# Patient Record
Sex: Male | Born: 1990
Health system: Southern US, Community
[De-identification: ages and names within clinical notes are randomized; demographics above are authoritative.]

## PROBLEM LIST (undated history)

## (undated) DIAGNOSIS — Z789 Other specified health status: Secondary | ICD-10-CM

## (undated) HISTORY — DX: Other specified health status: Z78.9

## (undated) HISTORY — PX: OTHER SURGICAL HISTORY: SHX169

---
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
## 2012-12-31 ENCOUNTER — Encounter (HOSPITAL_COMMUNITY): Payer: Self-pay

## 2012-12-31 ENCOUNTER — Emergency Department (HOSPITAL_COMMUNITY): Payer: Self-pay

## 2012-12-31 ENCOUNTER — Emergency Department (HOSPITAL_COMMUNITY)
Admission: EM | Admit: 2012-12-31 | Discharge: 2012-12-31 | Disposition: A | Discharge status: Home / Self Care | Payer: Self-pay | Attending: Emergency Medicine | Admitting: Emergency Medicine

## 2012-12-31 DIAGNOSIS — R1012 Left upper quadrant pain: Secondary | ICD-10-CM | POA: Insufficient documentation

## 2012-12-31 DIAGNOSIS — K921 Melena: Secondary | ICD-10-CM | POA: Insufficient documentation

## 2012-12-31 DIAGNOSIS — K625 Hemorrhage of anus and rectum: Secondary | ICD-10-CM | POA: Insufficient documentation

## 2012-12-31 LAB — CBC WITH DIFFERENTIAL/PLATELET
Basophils Absolute: 0 10*3/uL (ref 0.0–0.1)
Basophils Relative: 0 % (ref 0–1)
Eosinophils Absolute: 0 10*3/uL (ref 0.0–0.7)
Eosinophils Relative: 0 % (ref 0–5)
HCT: 44.8 % (ref 39.0–52.0)
Hemoglobin: 15.9 g/dL (ref 13.0–17.0)
Lymphocytes Relative: 15 % (ref 12–46)
Lymphs Abs: 1.3 10*3/uL (ref 0.7–4.0)
MCH: 27.1 pg (ref 26.0–34.0)
MCHC: 35.5 g/dL (ref 30.0–36.0)
MCV: 76.5 fL — ABNORMAL LOW (ref 78.0–100.0)
Monocytes Absolute: 0.6 10*3/uL (ref 0.1–1.0)
Monocytes Relative: 7 % (ref 3–12)
Neutro Abs: 6.8 10*3/uL (ref 1.7–7.7)
Neutrophils Relative %: 78 % — ABNORMAL HIGH (ref 43–77)
Platelets: 259 10*3/uL (ref 150–400)
RBC: 5.86 MIL/uL — ABNORMAL HIGH (ref 4.22–5.81)
RDW: 13.4 % (ref 11.5–15.5)
WBC: 8.8 10*3/uL (ref 4.0–10.5)

## 2012-12-31 LAB — COMPREHENSIVE METABOLIC PANEL
ALT: 25 U/L (ref 0–53)
AST: 29 U/L (ref 0–37)
Albumin: 4.7 g/dL (ref 3.5–5.2)
Alkaline Phosphatase: 69 U/L (ref 39–117)
BUN: 12 mg/dL (ref 6–23)
CO2: 24 mEq/L (ref 19–32)
Calcium: 10.2 mg/dL (ref 8.4–10.5)
Chloride: 104 mEq/L (ref 96–112)
Creatinine, Ser: 0.92 mg/dL (ref 0.50–1.35)
GFR calc Af Amer: >90 mL/min (ref >90)
GFR calc non Af Amer: >90 mL/min (ref >90)
Glucose, Bld: 96 mg/dL (ref 70–99)
Potassium: 5.7 mEq/L — ABNORMAL HIGH (ref 3.5–5.1)
Sodium: 138 mEq/L (ref 135–145)
Total Bilirubin: 0.4 mg/dL (ref 0.3–1.2)
Total Protein: 8.1 g/dL (ref 6.0–8.3)

## 2012-12-31 LAB — LIPASE, BLOOD: Lipase: 18 U/L (ref 11–59)

## 2012-12-31 MED ORDER — IOHEXOL 300 MG/ML  SOLN
100.0000 mL | Freq: Once | INTRAMUSCULAR | Status: AC | PRN
  Administered 2012-12-31: 100 mL via INTRAVENOUS

## 2012-12-31 MED ORDER — IOHEXOL 300 MG/ML  SOLN
25.0000 mL | INTRAMUSCULAR | Status: AC
  Administered 2012-12-31 (×2): 25 mL via ORAL

## 2012-12-31 MED ORDER — SODIUM CHLORIDE 0.9 % IV SOLN
INTRAVENOUS | Status: DC
  Administered 2012-12-31: 17:00:00 via INTRAVENOUS

## 2012-12-31 NOTE — ED Provider Notes (Signed)
History     CSN: [id number]  Arrival date & time 12/31/12  1532   First MD Initiated Contact with Patient 12/31/12 1609      Chief Complaint  Patient presents with  . Rectal Bleeding    (Consider location/radiation/quality/duration/timing/severity/associated sxs/prior treatment) HPI Comments: The ER for evaluation of rectal bleeding. Patient reports that he had a bowel movement this morning and there was some bright red blood mixed with the stool in the toilet. Stool was soft, but not diarrhea. He was having sharp pain in his left upper abdomen at that time. Pain is still present, but much improved. He has not had any fever, nausea or vomiting.  Patient reports that he has had several episodes of this in the past, but they have spontaneously resolved and he never sought medical treatment. Mother reports a history of ulcerative colitis.  Patient is a 22 y.o. male presenting with hematochezia.  Rectal Bleeding  Associated symptoms include abdominal pain. Pertinent negatives include no fever.    History reviewed. No pertinent past medical history.  History reviewed. No pertinent past surgical history.  No family history on file.  History  Substance Use Topics  . Smoking status: Never Smoker   . Smokeless tobacco: Not on file  . Alcohol Use: No      Review of Systems  Constitutional: Negative for fever.  Gastrointestinal: Positive for abdominal pain, hematochezia and anal bleeding.  All other systems reviewed and are negative.    Allergies  Review of patient's allergies indicates no known allergies.  Home Medications  No current outpatient prescriptions on file.  BP 148/66  Pulse 91  Temp(Src) 98.6 F (37 C) (Oral)  Resp 18  Ht 5\' 8"  (1.727 m)  Wt 130 lb (58.968 kg)  BMI 19.77 kg/m2  SpO2 100%  Physical Exam  Constitutional: He is oriented to person, place, and time. He appears well-developed and well-nourished. No distress.  HENT:  Head: Normocephalic and  atraumatic.  Right Ear: Hearing normal.  Nose: Nose normal.  Mouth/Throat: Oropharynx is clear and moist and mucous membranes are normal.  Eyes: Conjunctivae and EOM are normal. Pupils are equal, round, and reactive to light.  Neck: Normal range of motion. Neck supple.  Cardiovascular: Normal rate, regular rhythm, S1 normal and S2 normal.  Exam reveals no gallop and no friction rub.   No murmur heard. Pulmonary/Chest: Effort normal and breath sounds normal. No respiratory distress. He exhibits no tenderness.  Abdominal: Soft. Normal appearance and bowel sounds are normal. There is no hepatosplenomegaly. There is tenderness in the left upper quadrant. There is no rebound, no guarding, no tenderness at McBurney's point and negative Murphy's sign. No hernia.  Musculoskeletal: Normal range of motion.  Neurological: He is alert and oriented to person, place, and time. He has normal strength. No cranial nerve deficit or sensory deficit. Coordination normal. GCS eye subscore is 4. GCS verbal subscore is 5. GCS motor subscore is 6.  Skin: Skin is warm, dry and intact. No rash noted. No cyanosis.  Psychiatric: He has a normal mood and affect. His speech is normal and behavior is normal. Thought content normal.    ED Course  Procedures (including critical care time)  Labs Reviewed  CBC WITH DIFFERENTIAL - Abnormal; Notable for the following:    RBC 5.86 (*)    MCV 76.5 (*)    Neutrophils Relative 78 (*)    All other components within normal limits  COMPREHENSIVE METABOLIC PANEL - Abnormal; Notable for the following:  Potassium 5.7 (*)    All other components within normal limits  LIPASE, BLOOD   Ct Abdomen Pelvis W Contrast  12/31/2012  *RADIOLOGY REPORT*  Clinical Data: Blood in stool.  Family history of ulcerative colitis.  CT ABDOMEN AND PELVIS WITH CONTRAST  Technique:  Multidetector CT imaging of the abdomen and pelvis was performed following the standard protocol during bolus  administration of intravenous contrast.  Contrast: OMNIPAQUE IOHEXOL 300 MG/ML  SOLN  Comparison: None.  Findings: The liver, spleen, pancreas, and adrenal glands appear unremarkable.  The gallbladder and biliary system appear unremarkable.  The kidneys appear unremarkable, as do the proximal ureters.  No pathologic retroperitoneal or porta hepatis adenopathy is identified.  Minimal stranding at the root of the mesentery is nonspecific.  Appendix normal. No pathologic pelvic adenopathy is identified.  No dilated bowel observed.  Urinary bladder empty during imaging. Equivocal wall thickening in the descending colon may be secondary to nondistension rather than inflammation.  Orally administered contrast extends through to the transverse colon  Sacroiliac joints unremarkable.  IMPRESSION:  1.  Subtle edema signal in the root of the mesentery is nonspecific but can be a associated with inflammatory process in the abdomen. 2.  No definite colitis; the borderline descending colon wall thickening is probably attributable to nondistension. 3.  A specific cause for the patient's rectal bleeding is not observed.   Original Report Authenticated By: Gaylyn Rong, M.D.      Diagnoses: 1. Abdominal pain 2. Rectal bleeding    MDM  Patient comes to the ER with complaints of mild left upper abdominal discomfort followed by a bowel movement that had blood in it. He has had episode similar to this. Patient's mother indicates that she has inflammatory bowel disease. Patient underwent diverting CAT scan, therefore, to further evaluate. No obvious inflammatory bowel disease seen. Patient has not had any further bleeding here. Will refer to GI for further evaluation. Return if he has increased bleeding.        Gilda Crease, MD 12/31/12 [phone]

## 2012-12-31 NOTE — ED Notes (Signed)
Pt.  Had a  BM this am and noticed bright red blood mixed with dk blood in the stool.    Soft stool.  Also happened in January.  Generalized abdominal pain, cramping.   Denies any n/v

## 2014-05-29 ENCOUNTER — Emergency Department (HOSPITAL_COMMUNITY)
Admission: EM | Admit: 2014-05-29 | Discharge: 2014-05-29 | Disposition: A | Discharge status: Home / Self Care | Payer: Self-pay | Attending: Emergency Medicine | Admitting: Emergency Medicine

## 2014-05-29 ENCOUNTER — Encounter (HOSPITAL_COMMUNITY): Payer: Self-pay | Admitting: Emergency Medicine

## 2014-05-29 DIAGNOSIS — R21 Rash and other nonspecific skin eruption: Secondary | ICD-10-CM

## 2014-05-29 MED ORDER — DIPHENHYDRAMINE HCL 25 MG PO TABS: 25.0000 mg | ORAL_TABLET | Freq: Four times a day (QID) | ORAL | Status: DC | PRN

## 2014-05-29 MED ORDER — DIPHENHYDRAMINE HCL 25 MG PO CAPS
25.0000 mg | ORAL_CAPSULE | Freq: Once | ORAL | Status: AC
  Administered 2014-05-29: 25 mg via ORAL
  Filled 2014-05-29: qty 1

## 2014-05-29 MED ORDER — CARRINGTON MOISTURE BARRIER EX CREA: TOPICAL_CREAM | CUTANEOUS | Status: DC | PRN

## 2014-05-29 MED ORDER — HYDROCORTISONE 1 % EX CREA: 1.0000 "application " | TOPICAL_CREAM | Freq: Two times a day (BID) | CUTANEOUS | Status: DC

## 2014-05-29 MED ORDER — HYDROCORTISONE 1 % EX CREA
TOPICAL_CREAM | Freq: Three times a day (TID) | CUTANEOUS | Status: DC
  Administered 2014-05-29: 16:00:00 via TOPICAL
  Filled 2014-05-29: qty 28

## 2014-05-29 NOTE — Discharge Instructions (Signed)
Please follow up with your primary care physician in 1-2 days. If you do not have one please call the [facility] and wellness Center number listed above. Please take all medications as prescribed. Please read all discharge instructions and return precautions.   Rash A rash is a change in the color or texture of your skin. There are many different types of rashes. You may have other problems that accompany your rash. CAUSES   Infections.  Allergic reactions. This can include allergies to pets or foods.  Certain medicines.  Exposure to certain chemicals, soaps, or cosmetics.  Heat.  Exposure to poisonous plants.  Tumors, both cancerous and noncancerous. SYMPTOMS   Redness.  Scaly skin.  Itchy skin.  Dry or cracked skin.  Bumps.  Blisters.  Pain. DIAGNOSIS  Your caregiver may do a physical exam to determine what type of rash you have. A skin sample (biopsy) may be taken and examined under a microscope. TREATMENT  Treatment depends on the type of rash you have. Your caregiver may prescribe certain medicines. For serious conditions, you may need to see a skin doctor (dermatologist). HOME CARE INSTRUCTIONS   Avoid the substance that caused your rash.  Do not scratch your rash. This can cause infection.  You may take cool baths to help stop itching.  Only take over-the-counter or prescription medicines as directed by your caregiver.  Keep all follow-up appointments as directed by your caregiver. SEEK IMMEDIATE MEDICAL CARE IF:  You have increasing pain, swelling, or redness.  You have a fever.  You have new or severe symptoms.  You have body aches, diarrhea, or vomiting.  Your rash is not better after 3 days. MAKE SURE YOU:  Understand these instructions.  Will watch your condition.  Will get help right away if you are not doing well or get worse. Document Released: 08/09/2002 Document Revised: 11/11/2011 Document Reviewed: 06/03/2011 [facility] Patient  Information 2015 [place], [place]. This information is not intended to replace advice given to you by your health care provider. Make sure you discuss any questions you have with your health care provider.

## 2014-05-29 NOTE — ED Notes (Signed)
Pt presents to department for evaluation of rash to R leg. Ongoing for several weeks. States small itchy patches, thinks this could be a spider bite. Pt is alert and oriented x4. NAD.

## 2014-05-29 NOTE — ED Provider Notes (Signed)
Medical screening examination/treatment/procedure(s) were performed by non-physician practitioner and as supervising physician I was immediately available for consultation/collaboration.   EKG Interpretation None        Courtney F Horton, MD 05/29/14 1944 

## 2014-05-29 NOTE — ED Provider Notes (Signed)
CSN: [id number]     Arrival date & time 05/29/14  1415 History  This chart was scribed for Francee Piccolo PA-C working with Shon Baton, MD by Freida Busman, ED Scribe. This patient was seen in room TR11C/TR11C and the patient's care was started at 3:39 PM.    Chief Complaint  Patient presents with  . Rash    The history is provided by the patient. No language interpreter was used.    HPI Comments:  Alim Cattell is a 23 y.o. male who presents to the Emergency Department complaining of a rash to his RLE that has been present for a few weeks. He describes the rash as itchy. He denies drainage from the site. No fever or chills. He has applied Hydrocortisone cream to the area with moderate relief. He notes a family h/o Psoriasis.   History reviewed. No pertinent past medical history. History reviewed. No pertinent past surgical history. History reviewed. No pertinent family history. History  Substance Use Topics  . Smoking status: Never Smoker   . Smokeless tobacco: Not on file  . Alcohol Use: No    Review of Systems  Constitutional: Negative for fever and chills.  Skin: Positive for rash.  All other systems reviewed and are negative.     Allergies  Review of patient's allergies indicates no known allergies.  Home Medications   Prior to Admission medications   Medication Sig Start Date End Date Taking? Authorizing Provider  diphenhydrAMINE (BENADRYL) 25 MG tablet Take 1 tablet (25 mg total) by mouth every 6 (six) hours as needed for itching (Rash). 05/29/14   Kyisha Fowle L Phil Michels, PA-C  hydrocortisone cream 1 % Apply 1 application topically 2 (two) times daily. 05/29/14   Zhaniya Swallows L Torra Pala, PA-C  Skin Protectants, Misc. (EUCERIN) cream Apply topically as needed. Apply topically for dry skin 05/29/14   Mozella Rexrode L Aerilynn Goin, PA-C   BP 130/76  Pulse 76  Temp(Src) 98 F (36.7 C) (Oral)  Resp 18  Ht  (1.727 m)  Wt 137 lb (62.143 kg)  BMI 20.84  kg/m2  SpO2 100% Physical Exam  Nursing note and vitals reviewed. Constitutional: He is oriented to person, place, and time. He appears well-developed and well-nourished. No distress.  HENT:  Head: Normocephalic and atraumatic.  Right Ear: External ear normal.  Left Ear: External ear normal.  Nose: Nose normal.  Mouth/Throat: Oropharynx is clear and moist.  Eyes: Conjunctivae are normal.  Neck: Normal range of motion. Neck supple.  Cardiovascular: Normal rate and regular rhythm.   Pulmonary/Chest: Effort normal and breath sounds normal.  Abdominal: Soft.  Musculoskeletal: Normal range of motion.  Neurological: He is alert and oriented to person, place, and time.  Skin: Skin is warm and dry. He is not diaphoretic.     Psychiatric: He has a normal mood and affect.    ED Course  Procedures  Medications  hydrocortisone cream 1 % ( Topical Given 05/29/14 1538)  diphenhydrAMINE (BENADRYL) capsule 25 mg (25 mg Oral Given 05/29/14 1537)    DIAGNOSTIC STUDIES:  Oxygen Saturation is 100% on RA, normal by my interpretation.    COORDINATION OF CARE:  3:39 PM Discussed treatment plan with pt at bedside and pt agreed to plan.  Labs Review Labs Reviewed - No data to display  Imaging Review No results found.   EKG Interpretation None      MDM   Final diagnoses:  Rash and nonspecific skin eruption    Filed Vitals:   05/29/14 1421  BP: 130/76  Pulse: 76  Temp: 98 F (36.7 C)  Resp: 18   Afebrile, NAD, non-toxic appearing, AAOx4.  No evidence of SJS or necrotizing fasciitis. Due to pruritic and not painful nature of blisters do not suspect pemphigus vulgaris. Pustules do not resemble scabies as per pt hx or allergic reaction. No blisters, no pustules, no warmth, no draining sinus tracts, no superficial abscesses, no bullous impetigo, no vesicles, no desquamation, no target lesions with dusky purpura or a central bulla. Not tender to touch. Will prescribed hydrocortisone  cream, Benadryl, and Eucerin with advised PCP f/u. Patient is stable at time of discharge  I personally performed the services described in this documentation, which was scribed in my presence. The recorded information has been reviewed and is accurate.    Jeannetta Ellis, PA-C 05/29/14 1539

## 2014-08-18 ENCOUNTER — Encounter: Payer: Self-pay | Admitting: Pediatrics

## 2018-08-27 ENCOUNTER — Emergency Department (HOSPITAL_COMMUNITY)
Admission: EM | Admit: 2018-08-27 | Discharge: 2018-08-28 | Disposition: A | Discharge status: Home / Self Care | Payer: BLUE CROSS/BLUE SHIELD | Attending: Emergency Medicine | Admitting: Emergency Medicine

## 2018-08-27 DIAGNOSIS — M545 Low back pain: Secondary | ICD-10-CM | POA: Diagnosis present

## 2018-08-27 DIAGNOSIS — S39012A Strain of muscle, fascia and tendon of lower back, initial encounter: Secondary | ICD-10-CM | POA: Diagnosis not present

## 2018-08-27 DIAGNOSIS — M542 Cervicalgia: Secondary | ICD-10-CM | POA: Insufficient documentation

## 2018-08-27 DIAGNOSIS — Y939 Activity, unspecified: Secondary | ICD-10-CM | POA: Insufficient documentation

## 2018-08-27 DIAGNOSIS — Y999 Unspecified external cause status: Secondary | ICD-10-CM | POA: Insufficient documentation

## 2018-08-27 DIAGNOSIS — R0789 Other chest pain: Secondary | ICD-10-CM | POA: Insufficient documentation

## 2018-08-27 DIAGNOSIS — Y929 Unspecified place or not applicable: Secondary | ICD-10-CM | POA: Diagnosis not present

## 2018-08-28 ENCOUNTER — Emergency Department (HOSPITAL_COMMUNITY): Payer: BLUE CROSS/BLUE SHIELD

## 2018-08-28 ENCOUNTER — Encounter (HOSPITAL_COMMUNITY): Payer: Self-pay

## 2018-08-28 ENCOUNTER — Other Ambulatory Visit: Payer: Self-pay

## 2018-08-28 MED ORDER — NAPROXEN 500 MG PO TABS: 500.0000 mg | ORAL_TABLET | Freq: Two times a day (BID) | ORAL | 0 refills | Status: DC

## 2018-08-28 MED ORDER — METHOCARBAMOL 500 MG PO TABS: 500.0000 mg | ORAL_TABLET | Freq: Two times a day (BID) | ORAL | 0 refills | Status: DC

## 2018-08-28 NOTE — Discharge Instructions (Signed)
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
## 2018-11-24 ENCOUNTER — Encounter (HOSPITAL_COMMUNITY): Payer: Self-pay

## 2018-11-24 ENCOUNTER — Ambulatory Visit (HOSPITAL_COMMUNITY)
Admission: EM | Admit: 2018-11-24 | Discharge: 2018-11-24 | Disposition: A | Discharge status: Home / Self Care | Payer: BLUE CROSS/BLUE SHIELD | Attending: Family Medicine | Admitting: Family Medicine

## 2018-11-24 ENCOUNTER — Other Ambulatory Visit: Payer: Self-pay

## 2018-11-24 DIAGNOSIS — J029 Acute pharyngitis, unspecified: Secondary | ICD-10-CM

## 2018-11-24 DIAGNOSIS — R21 Rash and other nonspecific skin eruption: Secondary | ICD-10-CM | POA: Diagnosis present

## 2018-11-24 LAB — POCT RAPID STREP A: Streptococcus, Group A Screen (Direct): NEGATIVE

## 2018-11-24 MED ORDER — TRIAMCINOLONE ACETONIDE 0.1 % EX CREA: 1.0000 "application " | TOPICAL_CREAM | Freq: Two times a day (BID) | CUTANEOUS | 0 refills | Status: DC

## 2018-11-24 MED ORDER — CHLORHEXIDINE GLUCONATE 0.12 % MT SOLN: 15.0000 mL | Freq: Two times a day (BID) | OROMUCOSAL | 0 refills | Status: DC

## 2018-11-24 NOTE — ED Provider Notes (Signed)
[facility]    CSN: [id number] Arrival date & time: 11/24/18  1902     History   Chief Complaint Chief Complaint  Patient presents with  . Sore Throat    HPI Scott Small is a 28 y.o. male.   Pt cc sore throat and penis has a sore on it x 3 weeks. Travel none, fever none, and coughing none.  The sore throat is ongoing with varying degrees of irritation.  He has not had a day off from [employer] for weeks.  He also has a small area of scaling irritation on the torsal rim (corona) of the glans.  This is nontender.  His girlfriend tested negative for HSV.     History reviewed. No pertinent past medical history.  There are no active problems to display for this patient.   History reviewed. No pertinent surgical history.     Home Medications    Prior to Admission medications   Medication Sig Start Date End Date Taking? Authorizing Provider  chlorhexidine (PERIDEX) 0.12 % solution Use as directed 15 mLs in the mouth or throat 2 (two) times daily. 11/24/18   Elvina Sidle, MD  triamcinolone cream (KENALOG) 0.1 % Apply 1 application topically 2 (two) times daily. 11/24/18   Elvina Sidle, MD    Family History Family History  Problem Relation Age of Onset  . Asthma Mother   . Diabetes Mother   . Thyroid disease Mother     Social History Social History   Tobacco Use  . Smoking status: Never Smoker  . Smokeless tobacco: Never Used  Substance Use Topics  . Alcohol use: No  . Drug use: No     Allergies   Patient has no known allergies.   Review of Systems Review of Systems  Constitutional: Negative.   HENT: Positive for sore throat.   Skin: Positive for rash.  All other systems reviewed and are negative.    Physical Exam Triage Vital Signs ED Triage Vitals  Enc Vitals Group     BP 11/24/18 1922 103/66     Pulse Rate 11/24/18 1922 84     Resp 11/24/18 1922 18     Temp 11/24/18 1922 98.7 F (37.1 C)     Temp Source 11/24/18 1922 Oral      SpO2 11/24/18 1922 100 %     Weight 11/24/18 1916 165 lb (74.8 kg)     Height --      Head Circumference --      Peak Flow --      Pain Score 11/24/18 1916 1     Pain Loc --      Pain Edu? --      Excl. in [place]? --    No data found.  Updated Vital Signs BP 103/66 (BP Location: Right Arm)   Pulse 84   Temp 98.7 F (37.1 C) (Oral)   Resp 18   Wt 74.8 kg   SpO2 100%   BMI 25.09 kg/m    Physical Exam Vitals signs and nursing note reviewed.  Constitutional:      Appearance: He is well-developed and normal weight.  HENT:     Head: Normocephalic.     Mouth/Throat:     Mouth: Mucous membranes are moist. No oral lesions.     Pharynx: Uvula midline. Posterior oropharyngeal erythema present. No pharyngeal swelling or oropharyngeal exudate.  Eyes:     Conjunctiva/sclera: Conjunctivae normal.  Neck:     Musculoskeletal: Normal range of motion  and neck supple.  Pulmonary:     Effort: Pulmonary effort is normal.  Skin:    General: Skin is warm and dry.  Neurological:     General: No focal deficit present.     Mental Status: He is alert and oriented to person, place, and time.  Psychiatric:        Mood and Affect: Mood normal.      UC Treatments / Results  Labs (all labs ordered are listed, but only abnormal results are displayed) Labs Reviewed  CULTURE, GROUP A STREP ([facility])  RPR  POCT RAPID STREP A    EKG None  Radiology No results found.  Procedures Procedures (including critical care time)  Medications Ordered in UC Medications - No data to display  Initial Impression / Assessment and Plan / UC Course  I have reviewed the triage vital signs and the nursing notes.  Pertinent labs & imaging results that were available during my care of the patient were reviewed by me and considered in my medical decision making (see chart for details).    Final Clinical Impressions(s) / UC Diagnoses   Final diagnoses:  Viral pharyngitis  Rash and nonspecific skin  eruption     Discharge Instructions     The rash is nondiagnostic.  We are running a simple blood test to rule out a bacteria.  It is most likely from friction  The strep test is negative.    ED Prescriptions    Medication Sig Dispense Auth. Provider   chlorhexidine (PERIDEX) 0.12 % solution Use as directed 15 mLs in the mouth or throat 2 (two) times daily. 120 mL Elvina Sidle, MD   triamcinolone cream (KENALOG) 0.1 % Apply 1 application topically 2 (two) times daily. 30 g Elvina Sidle, MD     Controlled Substance Prescriptions [place] Controlled Substance Registry consulted? Not Applicable   Elvina Sidle, MD 11/24/18 [phone]

## 2018-11-24 NOTE — Discharge Instructions (Signed)
The rash is nondiagnostic.  We are running a simple blood test to rule out a bacteria.  It is most likely from friction  The strep test is negative.

## 2018-11-24 NOTE — ED Triage Notes (Signed)
Pt cc sore throat and penis has a sore on it x 3 weeks. Travel none, fever none, and coughing none

## 2018-11-25 LAB — RPR: RPR Ser Ql: NONREACTIVE

## 2018-11-27 LAB — CULTURE, GROUP A STREP (THRC)

## 2019-09-20 ENCOUNTER — Other Ambulatory Visit: Payer: Self-pay

## 2019-09-20 ENCOUNTER — Ambulatory Visit: Payer: BLUE CROSS/BLUE SHIELD | Attending: Internal Medicine

## 2019-09-20 DIAGNOSIS — Z20822 Contact with and (suspected) exposure to covid-19: Secondary | ICD-10-CM | POA: Insufficient documentation

## 2019-09-21 LAB — NOVEL CORONAVIRUS, NAA: SARS-CoV-2, NAA: NOT DETECTED

## 2019-09-27 ENCOUNTER — Ambulatory Visit: Payer: BLUE CROSS/BLUE SHIELD | Attending: Family

## 2019-09-27 ENCOUNTER — Other Ambulatory Visit: Payer: Self-pay

## 2019-09-27 DIAGNOSIS — Z20822 Contact with and (suspected) exposure to covid-19: Secondary | ICD-10-CM

## 2019-09-28 LAB — NOVEL CORONAVIRUS, NAA: SARS-CoV-2, NAA: NOT DETECTED

## 2019-09-29 ENCOUNTER — Telehealth: Payer: Self-pay | Admitting: General Practice

## 2019-09-29 NOTE — Telephone Encounter (Signed)
Negative COVID results given. Patient results "NOT Detected." Caller expressed understanding. ° °
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
## 2022-06-12 ENCOUNTER — Encounter (HOSPITAL_COMMUNITY): Payer: Self-pay

## 2022-06-12 ENCOUNTER — Emergency Department (HOSPITAL_COMMUNITY)
Admission: EM | Admit: 2022-06-12 | Discharge: 2022-06-12 | Disposition: A | Discharge status: Home / Self Care | Payer: BLUE CROSS/BLUE SHIELD | Attending: Emergency Medicine | Admitting: Emergency Medicine

## 2022-06-12 ENCOUNTER — Other Ambulatory Visit: Payer: Self-pay

## 2022-06-12 DIAGNOSIS — R251 Tremor, unspecified: Secondary | ICD-10-CM | POA: Insufficient documentation

## 2022-06-12 DIAGNOSIS — T50901A Poisoning by unspecified drugs, medicaments and biological substances, accidental (unintentional), initial encounter: Secondary | ICD-10-CM | POA: Insufficient documentation

## 2022-06-12 DIAGNOSIS — F419 Anxiety disorder, unspecified: Secondary | ICD-10-CM | POA: Insufficient documentation

## 2022-06-12 DIAGNOSIS — R209 Unspecified disturbances of skin sensation: Secondary | ICD-10-CM | POA: Insufficient documentation

## 2022-06-12 DIAGNOSIS — R1084 Generalized abdominal pain: Secondary | ICD-10-CM | POA: Insufficient documentation

## 2022-06-12 NOTE — Discharge Instructions (Signed)
Please monitor your condition carefully and do not hesitate to return here for any concerning changes in your condition. °
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
## 2022-09-14 ENCOUNTER — Ambulatory Visit
Admission: EM | Admit: 2022-09-14 | Discharge: 2022-09-14 | Disposition: A | Discharge status: Home / Self Care | Payer: Self-pay | Attending: Family Medicine | Admitting: Family Medicine

## 2022-09-14 DIAGNOSIS — R300 Dysuria: Secondary | ICD-10-CM | POA: Insufficient documentation

## 2022-09-14 DIAGNOSIS — K6289 Other specified diseases of anus and rectum: Secondary | ICD-10-CM | POA: Insufficient documentation

## 2022-09-14 DIAGNOSIS — Z202 Contact with and (suspected) exposure to infections with a predominantly sexual mode of transmission: Secondary | ICD-10-CM | POA: Insufficient documentation

## 2022-09-14 MED ORDER — HYDROCORTISONE ACETATE 25 MG RE SUPP: 25.0000 mg | Freq: Two times a day (BID) | RECTAL | 0 refills | Status: DC | PRN

## 2022-09-14 MED ORDER — DOXYCYCLINE HYCLATE 100 MG PO CAPS: 100.0000 mg | ORAL_CAPSULE | Freq: Two times a day (BID) | ORAL | 0 refills | Status: DC
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
## 2022-09-14 NOTE — ED Provider Notes (Signed)
[facility]    CSN: [id number] Arrival date & time: 09/14/22  1451      History   Chief Complaint Chief Complaint  Patient presents with   Burn    Entered by patient    HPI Scott Small is a 32 y.o. male.   Patient presenting today with almost 2 weeks of dysuria and states a partner he was with about a month ago just told him yesterday that they had chlamydia.  Denies penile discharge, hematuria, rashes, lesions, pelvic or abdominal pain.  Also states he eats a lot of spicy foods and has noticed recently some anal irritation and frequent bowel movements.  He is wondering if he has hemorrhoids.  So far not tried anything for either side of symptoms.    History reviewed. No pertinent past medical history.  There are no problems to display for this patient.   History reviewed. No pertinent surgical history.     Home Medications    Prior to Admission medications   Medication Sig Start Date End Date Taking? Authorizing Provider  doxycycline (VIBRAMYCIN) 100 MG capsule Take 1 capsule (100 mg total) by mouth 2 (two) times daily. 09/14/22  Yes Volney American, PA-C  hydrocortisone (ANUSOL-HC) 25 MG suppository Place 1 suppository (25 mg total) rectally 2 (two) times daily as needed for hemorrhoids or anal itching. 09/14/22  Yes Volney American, PA-C    Family History Family History  Problem Relation Age of Onset   Asthma Mother    Diabetes Mother    Thyroid disease Mother     Social History Social History   Tobacco Use   Smoking status: Never   Smokeless tobacco: Never  Vaping Use   Vaping Use: Never used  Substance Use Topics   Alcohol use: No   Drug use: Yes    Types: Marijuana     Allergies   Patient has no known allergies.   Review of Systems Review of Systems PER HPI  Physical Exam Triage Vital Signs ED Triage Vitals [09/14/22 1502]  Enc Vitals Group     BP (!) 157/90     Pulse Rate 80     Resp 18     Temp  97.9 F (36.6 C)     Temp Source Oral     SpO2 98 %     Weight      Height      Head Circumference      Peak Flow      Pain Score      Pain Loc      Pain Edu?      Excl. in [place]?    No data found.  Updated Vital Signs BP (!) 157/90 (BP Location: Right Arm)   Pulse 80   Temp 97.9 F (36.6 C) (Oral)   Resp 18   SpO2 98%   Visual Acuity Right Eye Distance:   Left Eye Distance:   Bilateral Distance:    Right Eye Near:   Left Eye Near:    Bilateral Near:     Physical Exam Vitals and nursing note reviewed.  Constitutional:      Appearance: Normal appearance.  HENT:     Head: Atraumatic.     Mouth/Throat:     Mouth: Mucous membranes are moist.  Eyes:     Extraocular Movements: Extraocular movements intact.     Conjunctiva/sclera: Conjunctivae normal.  Cardiovascular:     Rate and Rhythm: Normal rate and regular rhythm.  Pulmonary:  Effort: Pulmonary effort is normal.     Breath sounds: Normal breath sounds.  Abdominal:     General: Bowel sounds are normal. There is no distension.     Palpations: Abdomen is soft.     Tenderness: There is no abdominal tenderness. There is no guarding.  Genitourinary:    Comments: GU exam deferred, self swab performed Musculoskeletal:        General: Normal range of motion.     Cervical back: Normal range of motion and neck supple.  Skin:    General: Skin is warm and dry.  Neurological:     General: No focal deficit present.     Mental Status: He is oriented to person, place, and time.  Psychiatric:        Mood and Affect: Mood normal.        Thought Content: Thought content normal.        Judgment: Judgment normal.      UC Treatments / Results  Labs (all labs ordered are listed, but only abnormal results are displayed) Labs Reviewed  CYTOLOGY, (ORAL, ANAL, URETHRAL) ANCILLARY ONLY    EKG   Radiology No results found.  Procedures Procedures (including critical care time)  Medications Ordered in  UC Medications - No data to display  Initial Impression / Assessment and Plan / UC Course  I have reviewed the triage vital signs and the nursing notes.  Pertinent labs & imaging results that were available during my care of the patient were reviewed by me and considered in my medical decision making (see chart for details).     Cytology swab pending, given known exposure to chlamydia and consistent symptoms will start doxycycline while awaiting results and adjust if needed.  Discussed abstinence until at least 7 days after completion of treatment.  Suspect hemorrhoids/irritation from spicy food causing his anal symptoms so we will treat with Anusol suppositories, sitz bath's, bland diet and monitoring for improvement.  Return for worsening symptoms.  Final Clinical Impressions(s) / UC Diagnoses   Final diagnoses:  Dysuria  Exposure to chlamydia  Anal irritation   Discharge Instructions   None    ED Prescriptions     Medication Sig Dispense Auth. Provider   doxycycline (VIBRAMYCIN) 100 MG capsule Take 1 capsule (100 mg total) by mouth 2 (two) times daily. 14 capsule Volney American, [place]   hydrocortisone (ANUSOL-HC) 25 MG suppository Place 1 suppository (25 mg total) rectally 2 (two) times daily as needed for hemorrhoids or anal itching. 20 suppository Volney American, [place]      PDMP not reviewed this encounter.   Volney American, [place] 09/14/22 1540

## 2022-09-16 ENCOUNTER — Emergency Department (HOSPITAL_COMMUNITY): Payer: Self-pay

## 2022-09-16 ENCOUNTER — Other Ambulatory Visit: Payer: Self-pay

## 2022-09-16 ENCOUNTER — Emergency Department (HOSPITAL_COMMUNITY)
Admission: EM | Admit: 2022-09-16 | Discharge: 2022-09-16 | Disposition: A | Discharge status: Home / Self Care | Payer: Self-pay | Attending: Emergency Medicine | Admitting: Emergency Medicine

## 2022-09-16 DIAGNOSIS — R079 Chest pain, unspecified: Secondary | ICD-10-CM | POA: Insufficient documentation

## 2022-09-16 DIAGNOSIS — E871 Hypo-osmolality and hyponatremia: Secondary | ICD-10-CM | POA: Insufficient documentation

## 2022-09-16 LAB — CBC
HCT: 49 % (ref 39.0–52.0)
Hemoglobin: 16.2 g/dL (ref 13.0–17.0)
MCH: 26.7 pg (ref 26.0–34.0)
MCHC: 33.1 g/dL (ref 30.0–36.0)
MCV: 80.7 fL (ref 80.0–100.0)
Platelets: 327 10*3/uL (ref 150–400)
RBC: 6.07 MIL/uL — ABNORMAL HIGH (ref 4.22–5.81)
RDW: 13.2 % (ref 11.5–15.5)
WBC: 7.5 10*3/uL (ref 4.0–10.5)
nRBC: 0 % (ref 0.0–0.2)

## 2022-09-16 LAB — BASIC METABOLIC PANEL
Anion gap: 10 (ref 5–15)
BUN: 18 mg/dL (ref 6–20)
CO2: 23 mmol/L (ref 22–32)
Calcium: 9 mg/dL (ref 8.9–10.3)
Chloride: 101 mmol/L (ref 98–111)
Creatinine, Ser: 1.02 mg/dL (ref 0.61–1.24)
GFR, Estimated: >60 mL/min (ref >60)
Glucose, Bld: 82 mg/dL (ref 70–99)
Potassium: 4 mmol/L (ref 3.5–5.1)
Sodium: 134 mmol/L — ABNORMAL LOW (ref 135–145)

## 2022-09-16 LAB — CYTOLOGY, (ORAL, ANAL, URETHRAL) ANCILLARY ONLY
Chlamydia: POSITIVE — AB
Comment: NEGATIVE
Comment: NEGATIVE
Comment: NORMAL
Neisseria Gonorrhea: NEGATIVE
Trichomonas: NEGATIVE

## 2022-09-16 LAB — TROPONIN I (HIGH SENSITIVITY): Troponin I (High Sensitivity): 2 ng/L (ref <18)

## 2022-09-16 NOTE — ED Provider Notes (Signed)
[facility] EMERGENCY DEPARTMENT Provider Note   CSN: [id number] Arrival date & time: 09/16/22  1238     History  Chief Complaint  Patient presents with   Chest Pain   HPI Scott Small is a 32 y.o. male presenting for chest pain. Started 2 weeks ago.  Chest pain is intermittent.  Feels sharp.  It is nonradiating and nonreproducible.  Worse with breathing in.  Denies associated shortness of breath.  Denies family history of heart disease.  Denies known history of diabetes, hypertension and hyperlipidemia.  States that recently has been waking up at night short of breath, anxious tingling in his legs.  He also states that driving down the road he can have the similar sensation that comes on all of a sudden with a sense of panic.   Chest Pain      Home Medications Prior to Admission medications   Medication Sig Start Date End Date Taking? Authorizing Provider  doxycycline (VIBRAMYCIN) 100 MG capsule Take 1 capsule (100 mg total) by mouth 2 (two) times daily. 09/14/22   Volney American, PA-C  hydrocortisone (ANUSOL-HC) 25 MG suppository Place 1 suppository (25 mg total) rectally 2 (two) times daily as needed for hemorrhoids or anal itching. 09/14/22   Volney American, PA-C      Allergies    Patient has no known allergies.    Review of Systems   Review of Systems  Cardiovascular:  Positive for chest pain.    Physical Exam Updated Vital Signs BP 132/76   Pulse 64   Resp 20   Ht 5\' 7"  (1.702 m)   Wt 71.7 kg   SpO2 100%   BMI 24.75 kg/m  Physical Exam Vitals and nursing note reviewed.  HENT:     Head: Normocephalic and atraumatic.     Mouth/Throat:     Mouth: Mucous membranes are moist.  Eyes:     General:        Right eye: No discharge.        Left eye: No discharge.     Conjunctiva/sclera: Conjunctivae normal.  Cardiovascular:     Rate and Rhythm: Normal rate and regular rhythm.     Pulses: Normal pulses.     Heart sounds: Normal heart  sounds.  Pulmonary:     Effort: Pulmonary effort is normal.     Breath sounds: Normal breath sounds.  Abdominal:     General: Abdomen is flat.     Palpations: Abdomen is soft.  Skin:    General: Skin is warm and dry.  Neurological:     General: No focal deficit present.  Psychiatric:        Mood and Affect: Mood normal.     ED Results / Procedures / Treatments   Labs (all labs ordered are listed, but only abnormal results are displayed) Labs Reviewed  BASIC METABOLIC PANEL - Abnormal; Notable for the following components:      Result Value   Sodium 134 (*)    All other components within normal limits  CBC - Abnormal; Notable for the following components:   RBC 6.07 (*)    All other components within normal limits  TROPONIN I (HIGH SENSITIVITY)    EKG EKG Interpretation  Date/Time:  Monday September 16 2022 13:16:21 EST Ventricular Rate:  70 PR Interval:  178 QRS Duration: 88 QT Interval:  356 QTC Calculation: 384 R Axis:   94 Text Interpretation: Normal sinus rhythm Rightward axis Borderline ECG No previous ECGs  available Confirmed by Aletta Edouard [phone]) on 09/16/2022 1:18:12 PM  Radiology DG Chest 2 View  Result Date: 09/16/2022 CLINICAL DATA:  Chest pain. EXAM: CHEST - 2 VIEW COMPARISON:  08/28/2018 FINDINGS: The heart size and mediastinal contours are within normal limits. Both lungs are clear. The visualized skeletal structures are unremarkable. Negative for a pneumothorax. IMPRESSION: No active cardiopulmonary disease. Electronically Signed   By: Markus Daft M.D.   On: 09/16/2022 13:37    Procedures Procedures    Medications Ordered in ED Medications - No data to display  ED Course/ Medical Decision Making/ A&P                             Medical Decision Making Amount and/or Complexity of Data Reviewed Labs: ordered. Radiology: ordered.   Initial Impression and Ddx 32 year old male who is well-appearing presenting for chest pain.  Physical exam  is unremarkable.  Differential diagnosis for this complaint includes ACS, PE, aortic dissection, and pneumonia. Patient PMH that increases complexity of ED encounter: None  Interpretation of Diagnostics I independent reviewed and interpreted the labs as followed: Mild hyponatremia  - I independently visualized the following imaging with scope of interpretation limited to determining acute life threatening conditions related to emergency care: Chest x-ray, which revealed no active acute cardiopulmonary process  I independently reviewed and interpreted EKG which revealed normal sinus rhythm.  Patient Reassessment and Ultimate Disposition/Management Doubt ACS given normal EKG and negative troponins also patient did not endorse risk factors or family history of heart disease.  Considered PE but unlikely given patient is not tachycardic and denied calf tenderness.  Considered aortic dissection but pulses were symmetric and patient is not hypertensive.  Considered pneumonia but unlikely given reassuring x-ray.  Given the nature of his chest pain and along with shortness of breath and feelings of panic and anxiety there could be component of anxiety or stress contributing to his symptoms.  Advised him to follow-up with his PCP for his atypical chest pain.  Patient management required discussion with the following services or consulting groups:  None  Complexity of Problems Addressed Acute complicated illness or Injury  Additional Data Reviewed and Analyzed Further history obtained from: None  Patient Encounter Risk Assessment None         Final Clinical Impression(s) / ED Diagnoses Final diagnoses:  Chest pain, unspecified type    Rx / DC Orders ED Discharge Orders     None         Harriet Pho, PA-C 09/16/22 1732    Sherwood Gambler, MD 09/17/22 1526

## 2022-09-16 NOTE — ED Triage Notes (Signed)
Pt presents with intermittent CP for 2 weeks, no history of anxiety per pt.

## 2022-09-16 NOTE — ED Provider Triage Note (Signed)
Emergency Medicine Provider Triage Evaluation Note  Scott Small , a 32 y.o. male  was evaluated in triage.  Pt complains of chest pain.  Started 2 weeks ago.  Chest pain is intermittent.  Feels sharp.  It is nonradiating and nonreproducible.  Worse with breathing in.  Denies associated shortness of breath.  Denies family history of heart disease.  States that recently has been waking up at night short of breath, anxious tingling in his legs.  He also states that driving down the road he can have the similar sensation that comes on all of a sudden with a sense of panic.  Review of Systems  Positive: See above Negative: See above  Physical Exam  BP 132/76   Pulse 64   Resp 20   Ht 5\' 7"  (1.702 m)   Wt 71.7 kg   SpO2 100%   BMI 24.75 kg/m  Gen:   Awake, no distress   Resp:  Normal effort  MSK:   Moves extremities without difficulty  Other:    Medical Decision Making  Medically screening exam initiated at 3:15 PM.  Appropriate orders placed.  Scott Small was informed that the remainder of the evaluation will be completed by another provider, this initial triage assessment does not replace that evaluation, and the importance of remaining in the ED until their evaluation is complete.  Work up started   Harriet Pho, PA-C 09/16/22 1518

## 2022-09-16 NOTE — Discharge Instructions (Signed)
Evaluation of your chest pain was overall reassuring.  Recommend that you follow-up with your PCP for ongoing chest pain and occurrences of anxiety and panic.  If you have new shortness of breath or worsening chest pain or any other concerns please turn to the emergency department for further evaluation.

## 2022-10-24 ENCOUNTER — Ambulatory Visit: Payer: Self-pay

## 2022-12-02 ENCOUNTER — Ambulatory Visit
Admission: EM | Admit: 2022-12-02 | Discharge: 2022-12-02 | Disposition: A | Discharge status: Home / Self Care | Payer: No Typology Code available for payment source | Attending: Family Medicine | Admitting: Family Medicine

## 2022-12-02 DIAGNOSIS — Z8619 Personal history of other infectious and parasitic diseases: Secondary | ICD-10-CM | POA: Diagnosis not present

## 2022-12-02 DIAGNOSIS — Z113 Encounter for screening for infections with a predominantly sexual mode of transmission: Secondary | ICD-10-CM | POA: Diagnosis not present

## 2022-12-02 MED ORDER — DOXYCYCLINE HYCLATE 100 MG PO CAPS: 100.0000 mg | ORAL_CAPSULE | Freq: Two times a day (BID) | ORAL | 0 refills | Status: DC

## 2022-12-02 NOTE — ED Triage Notes (Signed)
Pt requested test for STD's.

## 2022-12-02 NOTE — ED Provider Notes (Signed)
[facility]    CSN: [id number] Arrival date & time: 12/02/22  1037      History   Chief Complaint Chief Complaint  Patient presents with   Exposure to STD    HPI Jayde Tyrell Racca is a 32 y.o. male.   Patient presenting today requesting a full screening for STDs.  States he was seen for the same in January and had an exposure to chlamydia at the time so was given doxycycline but did not pick it up as he did not have insurance at the time.  He did test positive for chlamydia at this time per patient.  This was verified on chart review.  He is requesting the medication to be resent now that he has insurance.  He denies any symptoms to include dysuria, urinary frequency or burning, rashes, lesions and no new sexual partners.    History reviewed. No pertinent past medical history.  There are no problems to display for this patient.   History reviewed. No pertinent surgical history.     Home Medications    Prior to Admission medications   Medication Sig Start Date End Date Taking? Authorizing Provider  doxycycline (VIBRAMYCIN) 100 MG capsule Take 1 capsule (100 mg total) by mouth 2 (two) times daily. 12/02/22   Volney American, PA-C  hydrocortisone (ANUSOL-HC) 25 MG suppository Place 1 suppository (25 mg total) rectally 2 (two) times daily as needed for hemorrhoids or anal itching. 09/14/22   Volney American, PA-C    Family History Family History  Problem Relation Age of Onset   Asthma Mother    Diabetes Mother    Thyroid disease Mother     Social History Social History   Tobacco Use   Smoking status: Never   Smokeless tobacco: Never  Vaping Use   Vaping Use: Never used  Substance Use Topics   Alcohol use: Not Currently   Drug use: Never     Allergies   Patient has no known allergies.   Review of Systems Review of Systems PER HPI  Physical Exam Triage Vital Signs ED Triage Vitals  Enc Vitals Group     BP 12/02/22 1119  137/83     Pulse Rate 12/02/22 1119 76     Resp 12/02/22 1119 14     Temp 12/02/22 1119 99 F (37.2 C)     Temp Source 12/02/22 1119 Oral     SpO2 12/02/22 1119 96 %     Weight --      Height --      Head Circumference --      Peak Flow --      Pain Score 12/02/22 1123 0     Pain Loc --      Pain Edu? --      Excl. in [place]? --    No data found.  Updated Vital Signs BP 137/83 (BP Location: Right Arm)   Pulse 76   Temp 99 F (37.2 C) (Oral)   Resp 14   SpO2 96%   Visual Acuity Right Eye Distance:   Left Eye Distance:   Bilateral Distance:    Right Eye Near:   Left Eye Near:    Bilateral Near:     Physical Exam Vitals and nursing note reviewed.  Constitutional:      Appearance: Normal appearance.  HENT:     Head: Atraumatic.  Eyes:     Extraocular Movements: Extraocular movements intact.     Conjunctiva/sclera: Conjunctivae normal.  Cardiovascular:     Rate and Rhythm: Normal rate and regular rhythm.  Pulmonary:     Effort: Pulmonary effort is normal.     Breath sounds: Normal breath sounds.  Abdominal:     General: Bowel sounds are normal. There is no distension.     Palpations: Abdomen is soft.     Tenderness: There is no abdominal tenderness. There is no right CVA tenderness, left CVA tenderness or guarding.  Genitourinary:    Comments: GU exam deferred, self swab performed Musculoskeletal:        General: Normal range of motion.     Cervical back: Normal range of motion and neck supple.  Skin:    General: Skin is warm and dry.  Neurological:     General: No focal deficit present.     Mental Status: He is oriented to person, place, and time.  Psychiatric:        Mood and Affect: Mood normal.        Thought Content: Thought content normal.        Judgment: Judgment normal.    UC Treatments / Results  Labs (all labs ordered are listed, but only abnormal results are displayed) Labs Reviewed  HIV ANTIBODY (ROUTINE TESTING W REFLEX)  [facility]  CYTOLOGY,  (ORAL, ANAL, URETHRAL) ANCILLARY ONLY    EKG   Radiology No results found.  Procedures Procedures (including critical care time)  Medications Ordered in UC Medications - No data to display  Initial Impression / Assessment and Plan / UC Course  I have reviewed the triage vital signs and the nursing notes.  Pertinent labs & imaging results that were available during my care of the patient were reviewed by me and considered in my medical decision making (see chart for details).     Will resend doxycycline for known chlamydia infection as he states he never received the treatment.  Discussed abstinence for at least 1 week after completion of treatment and to notify partner so that they may be treated in full prior to resuming sexual activity.  HIV, syphilis and repeat cytology all pending, adjust treatment based on results if needed.  Final Clinical Impressions(s) / UC Diagnoses   Final diagnoses:  History of chlamydia  Screening examination for STI   Discharge Instructions   None    ED Prescriptions     Medication Sig Dispense Auth. Provider   doxycycline (VIBRAMYCIN) 100 MG capsule Take 1 capsule (100 mg total) by mouth 2 (two) times daily. 14 capsule Volney American, [place]      PDMP not reviewed this encounter.   Volney American, [place] 12/02/22 1222

## 2022-12-03 LAB — CYTOLOGY, (ORAL, ANAL, URETHRAL) ANCILLARY ONLY
Chlamydia: POSITIVE — AB
Comment: NEGATIVE
Comment: NEGATIVE
Comment: NORMAL
Neisseria Gonorrhea: NEGATIVE
Trichomonas: NEGATIVE

## 2022-12-03 LAB — RPR: RPR Ser Ql: NONREACTIVE

## 2022-12-03 LAB — HIV ANTIBODY (ROUTINE TESTING W REFLEX): HIV Screen 4th Generation wRfx: NONREACTIVE

## 2022-12-04 ENCOUNTER — Telehealth (HOSPITAL_COMMUNITY): Payer: Self-pay

## 2023-08-02 ENCOUNTER — Ambulatory Visit
Admission: EM | Admit: 2023-08-02 | Discharge: 2023-08-02 | Disposition: A | Discharge status: Home / Self Care | Payer: Self-pay | Attending: Family Medicine | Admitting: Family Medicine

## 2023-08-02 DIAGNOSIS — J069 Acute upper respiratory infection, unspecified: Secondary | ICD-10-CM | POA: Diagnosis not present

## 2023-08-02 DIAGNOSIS — Z113 Encounter for screening for infections with a predominantly sexual mode of transmission: Secondary | ICD-10-CM | POA: Insufficient documentation

## 2023-08-02 LAB — POC COVID19/FLU A&B COMBO
Covid Antigen, POC: NEGATIVE
Influenza A Antigen, POC: NEGATIVE
Influenza B Antigen, POC: NEGATIVE

## 2023-08-02 MED ORDER — PROMETHAZINE-DM 6.25-15 MG/5ML PO SYRP: 5.0000 mL | ORAL_SOLUTION | Freq: Four times a day (QID) | ORAL | 0 refills | Status: DC | PRN

## 2023-08-02 MED ORDER — PSEUDOEPHEDRINE HCL 60 MG PO TABS: 60.0000 mg | ORAL_TABLET | Freq: Three times a day (TID) | ORAL | 0 refills | Status: DC | PRN

## 2023-08-02 MED ORDER — FLUTICASONE PROPIONATE 50 MCG/ACT NA SUSP: 1.0000 | Freq: Two times a day (BID) | NASAL | 2 refills | Status: DC

## 2023-08-02 NOTE — Discharge Instructions (Addendum)
We will know if anything comes back positive on your STD screening.  I have sent in some medications to help with your upper respiratory symptoms and you may take over-the-counter medications such as Mucinex, ibuprofen, Tylenol additionally.  Follow-up for significantly worsening symptoms.

## 2023-08-02 NOTE — ED Triage Notes (Signed)
Pt reports he has a lost of taste and smell x 4 days.   Pt states   "he just wants to get testes. Its been a while.

## 2023-08-02 NOTE — ED Notes (Signed)
Blood sample obtained. Pt tolerated well. Dressing applied to site and pt reported "I feel like I might faint." Pt laid back, feet elevated. Offered PO fluids. Pt tolerating. Pt alert and oriented. NAD noted.

## 2023-08-03 LAB — RPR: RPR Ser Ql: NONREACTIVE

## 2023-08-03 LAB — HIV ANTIBODY (ROUTINE TESTING W REFLEX): HIV Screen 4th Generation wRfx: NONREACTIVE

## 2023-08-04 LAB — CYTOLOGY, (ORAL, ANAL, URETHRAL) ANCILLARY ONLY
Chlamydia: NEGATIVE
Comment: NEGATIVE
Comment: NEGATIVE
Comment: NORMAL
Neisseria Gonorrhea: NEGATIVE
Trichomonas: NEGATIVE

## 2023-08-05 ENCOUNTER — Ambulatory Visit
Admission: EM | Admit: 2023-08-05 | Discharge: 2023-08-05 | Disposition: A | Discharge status: Home / Self Care | Payer: Self-pay | Attending: Nurse Practitioner | Admitting: Nurse Practitioner

## 2023-08-05 ENCOUNTER — Encounter: Payer: Self-pay | Admitting: Emergency Medicine

## 2023-08-05 DIAGNOSIS — R051 Acute cough: Secondary | ICD-10-CM | POA: Diagnosis not present

## 2023-08-05 DIAGNOSIS — R042 Hemoptysis: Secondary | ICD-10-CM | POA: Diagnosis not present

## 2023-08-05 MED ORDER — BENZONATATE 100 MG PO CAPS: 100.0000 mg | ORAL_CAPSULE | Freq: Three times a day (TID) | ORAL | 0 refills | Status: DC | PRN

## 2023-08-05 NOTE — ED Provider Notes (Signed)
[facility]    CSN: [id number] Arrival date & time: 08/05/23  1537      History   Chief Complaint No chief complaint on file.   HPI Scott Small is a 32 y.o. male.   Patient presents today with 6 to 7-day history of congested cough, shortness of breath with coughing, runny and stuffy nose, sore throat, right ear pain without drainage, abdominal pain that is intermittent, decreased appetite, loss of taste and smell, and fatigue.  He denies known fevers, bodyaches or chills, wheezing, chest pain or tightness, sinus pressure or headache, nausea, vomiting, and diarrhea.  He was seen in urgent care on 08/02/2023, treated with Flonase nasal spray, Sudafed, Promethazine DM cough syrup reports has been taking everything except the Sudafed.  Woke up this morning and began coughing up blood and became concerned for pneumonia.    History reviewed. No pertinent past medical history.  There are no problems to display for this patient.   History reviewed. No pertinent surgical history.     Home Medications    Prior to Admission medications   Medication Sig Start Date End Date Taking? Authorizing Provider  benzonatate (TESSALON) 100 MG capsule Take 1 capsule (100 mg total) by mouth 3 (three) times daily as needed for cough. Do not take with alcohol or while driving or operating heavy machinery.  May cause drowsiness. 08/05/23  Yes Valentino Nose, NP  fluticasone (FLONASE) 50 MCG/ACT nasal spray Place 1 spray into both nostrils 2 (two) times daily. 08/02/23   Particia Nearing, PA-C  hydrocortisone (ANUSOL-HC) 25 MG suppository Place 1 suppository (25 mg total) rectally 2 (two) times daily as needed for hemorrhoids or anal itching. 09/14/22   Particia Nearing, PA-C  promethazine-dextromethorphan (PROMETHAZINE-DM) 6.25-15 MG/5ML syrup Take 5 mLs by mouth 4 (four) times daily as needed. 08/02/23   Particia Nearing, PA-C  pseudoephedrine (SUDAFED) 60 MG  tablet Take 1 tablet (60 mg total) by mouth every 8 (eight) hours as needed for congestion. 08/02/23   Particia Nearing, PA-C    Family History Family History  Problem Relation Age of Onset   Asthma Mother    Diabetes Mother    Thyroid disease Mother     Social History Social History   Tobacco Use   Smoking status: Never   Smokeless tobacco: Never  Vaping Use   Vaping status: Never Used  Substance Use Topics   Alcohol use: Not Currently   Drug use: Never     Allergies   Patient has no known allergies.   Review of Systems Review of Systems Per HPI  Physical Exam Triage Vital Signs ED Triage Vitals  Encounter Vitals Group     BP 08/05/23 1548 (!) 163/79     Systolic BP Percentile --      Diastolic BP Percentile --      Pulse Rate 08/05/23 1548 72     Resp 08/05/23 1548 18     Temp 08/05/23 1548 98.2 F (36.8 C)     Temp Source 08/05/23 1548 Oral     SpO2 08/05/23 1548 98 %     Weight --      Height --      Head Circumference --      Peak Flow --      Pain Score 08/05/23 1549 6     Pain Loc --      Pain Education --      Exclude from Growth Chart --  No data found.  Updated Vital Signs BP (!) 163/79 (BP Location: Right Arm)   Pulse 72   Temp 98.2 F (36.8 C) (Oral)   Resp 18   SpO2 98%   BP recheck: 144/84  Visual Acuity Right Eye Distance:   Left Eye Distance:   Bilateral Distance:    Right Eye Near:   Left Eye Near:    Bilateral Near:     Physical Exam Vitals and nursing note reviewed.  Constitutional:      General: He is not in acute distress.    Appearance: Normal appearance. He is not ill-appearing or toxic-appearing.  HENT:     Head: Normocephalic and atraumatic.     Right Ear: Tympanic membrane, ear canal and external ear normal.     Left Ear: Tympanic membrane, ear canal and external ear normal.     Nose: Congestion and rhinorrhea present.     Mouth/Throat:     Mouth: Mucous membranes are moist.     Pharynx:  Oropharynx is clear. No oropharyngeal exudate or posterior oropharyngeal erythema.  Eyes:     General: No scleral icterus.    Extraocular Movements: Extraocular movements intact.  Cardiovascular:     Rate and Rhythm: Normal rate and regular rhythm.  Pulmonary:     Effort: Pulmonary effort is normal. No respiratory distress.     Breath sounds: Normal breath sounds. No wheezing, rhonchi or rales.  Musculoskeletal:     Cervical back: Normal range of motion and neck supple.  Lymphadenopathy:     Cervical: No cervical adenopathy.  Skin:    General: Skin is warm and dry.     Coloration: Skin is not jaundiced or pale.     Findings: No erythema or rash.  Neurological:     Mental Status: He is alert and oriented to person, place, and time.  Psychiatric:        Behavior: Behavior is cooperative.      UC Treatments / Results  Labs (all labs ordered are listed, but only abnormal results are displayed) Labs Reviewed - No data to display  EKG   Radiology No results found.  Procedures Procedures (including critical care time)  Medications Ordered in UC Medications - No data to display  Initial Impression / Assessment and Plan / UC Course  I have reviewed the triage vital signs and the nursing notes.  Pertinent labs & imaging results that were available during my care of the patient were reviewed by me and considered in my medical decision making (see chart for details).   Patient is well-appearing, normotensive, afebrile, not tachycardic, not tachypneic, oxygenating well on room air.  I rechecked blood pressure and it was much improved from initial triage blood pressure.  1. Acute cough Suspect ongoing to viral upper respiratory infection Patient is negative for strep, COVID-19, and influenza Further viral testing deferred Supportive care discussed, start Tessalon Perles recommended starting decongestant as previously recommended Return and ER precautions discussed  2.  Hemoptysis Likely from Flonase nasal spray, he is also having bloody nasal drainage Lungs are clear to auscultation, SpO2 is normal Chest x-ray deferred No signs of pneumonia, reassurance provided Work excuse provided  The patient was given the opportunity to ask questions.  All questions answered to their satisfaction.  The patient is in agreement to this plan.    Final Clinical Impressions(s) / UC Diagnoses   Final diagnoses:  Acute cough  Hemoptysis     Discharge Instructions  You have a viral upper respiratory infection.  Symptoms should improve over the next week to 10 days.  If you develop chest pain or shortness of breath, go to the emergency room.  Some things that can make you feel better are: - Increased rest - Increasing fluid with water/sugar free electrolytes - Acetaminophen and ibuprofen as needed for fever/pain - Salt water gargling, chloraseptic spray and throat lozenges for sore throat - OTC guaifenesin (Mucinex) 600 mg twice daily for congestion - Saline sinus flushes or a neti pot - Humidifying the air -Tessalon Perles every 8 hours as needed for dry cough   Recommend stopping flonase nasal spray to see if this helps resolve the bleeding     ED Prescriptions     Medication Sig Dispense Auth. Provider   benzonatate (TESSALON) 100 MG capsule Take 1 capsule (100 mg total) by mouth 3 (three) times daily as needed for cough. Do not take with alcohol or while driving or operating heavy machinery.  May cause drowsiness. 21 capsule Valentino Nose, NP      PDMP not reviewed this encounter.   Valentino Nose, NP 08/05/23 1626

## 2023-08-05 NOTE — Discharge Instructions (Signed)
You have a viral upper respiratory infection.  Symptoms should improve over the next week to 10 days.  If you develop chest pain or shortness of breath, go to the emergency room.  Some things that can make you feel better are: - Increased rest - Increasing fluid with water/sugar free electrolytes - Acetaminophen and ibuprofen as needed for fever/pain - Salt water gargling, chloraseptic spray and throat lozenges for sore throat - OTC guaifenesin (Mucinex) 600 mg twice daily for congestion - Saline sinus flushes or a neti pot - Humidifying the air -Tessalon Perles every 8 hours as needed for dry cough   Recommend stopping flonase nasal spray to see if this helps resolve the bleeding

## 2023-08-05 NOTE — ED Triage Notes (Signed)
Was seen on Saturday for cough.  States early this morning, he noticed blood in his sputum and has continued through out the day.

## 2023-08-06 NOTE — ED Provider Notes (Signed)
[facility]    CSN: [id number] Arrival date & time: 08/02/23  1523      History   Chief Complaint Chief Complaint  Patient presents with   Sore Throat    HPI Scott Small is a 32 y.o. male.   Patient presenting today with 4-day history of congestion, loss of taste and smell, cough.  Denies fever, chills, chest pain, shortness of breath, abdominal pain, nausea vomiting or diarrhea.  So for now try anything over-the-counter for symptoms.  Also wanting STD screening.  No exposures or symptoms.    History reviewed. No pertinent past medical history.  There are no problems to display for this patient.   History reviewed. No pertinent surgical history.     Home Medications    Prior to Admission medications   Medication Sig Start Date End Date Taking? Authorizing Provider  fluticasone (FLONASE) 50 MCG/ACT nasal spray Place 1 spray into both nostrils 2 (two) times daily. 08/02/23  Yes Particia Nearing, PA-C  promethazine-dextromethorphan (PROMETHAZINE-DM) 6.25-15 MG/5ML syrup Take 5 mLs by mouth 4 (four) times daily as needed. 08/02/23  Yes Particia Nearing, PA-C  pseudoephedrine (SUDAFED) 60 MG tablet Take 1 tablet (60 mg total) by mouth every 8 (eight) hours as needed for congestion. 08/02/23  Yes Particia Nearing, PA-C  benzonatate (TESSALON) 100 MG capsule Take 1 capsule (100 mg total) by mouth 3 (three) times daily as needed for cough. Do not take with alcohol or while driving or operating heavy machinery.  May cause drowsiness. 08/05/23   Valentino Nose, NP  hydrocortisone (ANUSOL-HC) 25 MG suppository Place 1 suppository (25 mg total) rectally 2 (two) times daily as needed for hemorrhoids or anal itching. 09/14/22   Particia Nearing, PA-C    Family History Family History  Problem Relation Age of Onset   Asthma Mother    Diabetes Mother    Thyroid disease Mother     Social History Social History   Tobacco Use    Smoking status: Never   Smokeless tobacco: Never  Vaping Use   Vaping status: Never Used  Substance Use Topics   Alcohol use: Not Currently   Drug use: Never     Allergies   Patient has no known allergies.   Review of Systems Review of Systems Per HPI  Physical Exam Triage Vital Signs ED Triage Vitals  Encounter Vitals Group     BP 08/02/23 1534 131/87     Systolic BP Percentile --      Diastolic BP Percentile --      Pulse Rate 08/02/23 1534 70     Resp 08/02/23 1534 20     Temp 08/02/23 1534 98.7 F (37.1 C)     Temp Source 08/02/23 1534 Oral     SpO2 08/02/23 1534 97 %     Weight --      Height --      Head Circumference --      Peak Flow --      Pain Score 08/02/23 1536 7     Pain Loc --      Pain Education --      Exclude from Growth Chart --    No data found.  Updated Vital Signs BP 131/87 (BP Location: Right Arm)   Pulse 70   Temp 98.7 F (37.1 C) (Oral)   Resp 20   SpO2 97%   Visual Acuity Right Eye Distance:   Left Eye Distance:   Bilateral Distance:  Right Eye Near:   Left Eye Near:    Bilateral Near:     Physical Exam Vitals and nursing note reviewed.  Constitutional:      Appearance: He is well-developed.  HENT:     Head: Atraumatic.     Right Ear: External ear normal.     Left Ear: External ear normal.     Nose: Rhinorrhea present.     Mouth/Throat:     Pharynx: Posterior oropharyngeal erythema present. No oropharyngeal exudate.  Eyes:     Conjunctiva/sclera: Conjunctivae normal.     Pupils: Pupils are equal, round, and reactive to light.  Cardiovascular:     Rate and Rhythm: Normal rate and regular rhythm.  Pulmonary:     Effort: Pulmonary effort is normal. No respiratory distress.     Breath sounds: No wheezing or rales.  Genitourinary:    Comments: GU exam deferred, self swab performed Musculoskeletal:        General: Normal range of motion.     Cervical back: Normal range of motion and neck supple.  Lymphadenopathy:      Cervical: No cervical adenopathy.  Skin:    General: Skin is warm and dry.  Neurological:     Mental Status: He is alert and oriented to person, place, and time.  Psychiatric:        Behavior: Behavior normal.      UC Treatments / Results  Labs (all labs ordered are listed, but only abnormal results are displayed) Labs Reviewed  HIV ANTIBODY (ROUTINE TESTING W REFLEX)   Narrative:    Performed at:  [address] [facility] [address] [place], [place]  [id number] Lab Director: Jolene Schimke MD, Phone:  [phone]  [facility]   Narrative:    Performed at:  [address] [facility] [address] [place], [place]  [id number] Lab Director: Jolene Schimke MD, Phone:  [phone]  POC COVID19/FLU A&B COMBO  CYTOLOGY, (ORAL, ANAL, URETHRAL) ANCILLARY ONLY    EKG   Radiology No results found.  Procedures Procedures (including critical care time)  Medications Ordered in UC Medications - No data to display  Initial Impression / Assessment and Plan / UC Course  I have reviewed the triage vital signs and the nursing notes.  Pertinent labs & imaging results that were available during my care of the patient were reviewed by me and considered in my medical decision making (see chart for details).     Vital signs and exam overall reassuring and suggestive of a viral respiratory infection.  COVID and flu testing negative in clinic, treat symptomatically with Flonase, Sudafed, Phenergan DM, supportive over-the-counter medications and home care.  STD screening pending.  Treat based on results.  Final Clinical Impressions(s) / UC Diagnoses   Final diagnoses:  Screening examination for STI  Viral URI with cough     Discharge Instructions      We will know if anything comes back positive on your STD screening.  I have sent in some medications to help with your upper respiratory symptoms and you may take over-the-counter medications such as Mucinex, ibuprofen, Tylenol  additionally.  Follow-up for significantly worsening symptoms.    ED Prescriptions     Medication Sig Dispense Auth. Provider   fluticasone (FLONASE) 50 MCG/ACT nasal spray Place 1 spray into both nostrils 2 (two) times daily. 16 g Particia Nearing, [place]   pseudoephedrine (SUDAFED) 60 MG tablet Take 1 tablet (60 mg total) by mouth every 8 (eight) hours as needed for  congestion. 15 tablet Particia Nearing, [place]   promethazine-dextromethorphan (PROMETHAZINE-DM) 6.25-15 MG/5ML syrup Take 5 mLs by mouth 4 (four) times daily as needed. 100 mL Particia Nearing, [place]      PDMP not reviewed this encounter.   Roosvelt Maser [place], [place] 08/06/23 [phone]

## 2024-05-25 ENCOUNTER — Ambulatory Visit
Admission: RE | Admit: 2024-05-25 | Discharge: 2024-05-25 | Disposition: A | Discharge status: Home / Self Care | Payer: Self-pay

## 2024-05-25 ENCOUNTER — Other Ambulatory Visit: Payer: Self-pay

## 2024-05-25 VITALS — BP 144/86 | HR 70 | Temp 98.9°F | Resp 16

## 2024-05-25 DIAGNOSIS — G44209 Tension-type headache, unspecified, not intractable: Secondary | ICD-10-CM

## 2024-05-25 DIAGNOSIS — G4482 Headache associated with sexual activity: Secondary | ICD-10-CM

## 2024-05-25 NOTE — ED Provider Notes (Signed)
 [name]    CSN: [id number] Arrival date & time: 05/25/24  1404      History   Chief Complaint Chief Complaint  Patient presents with   Headache    Entered by patient    HPI Scott Small is a 33 y.o. male.   Very pleasant 33 year old gentleman who is here today for evaluation of headaches.  He has 2 different kinds of headaches.  First for the last couple months has had a headache almost every day.  He has tension across his neck and back.  He is under stress.  He states he has a headache when he wakes up in the morning.  He has a headache off and on most days.  He states that he had migraines when he was younger but this is different.  No visual symptoms.  No nausea or vomiting.  No photophobia The second type of headache he describes is with orgasm.  He states that with orgasm he gets a sudden severe headache that makes him need to rest for a while before he can continue activity.  He states that he had this once and it was terrible, he tried sexual activity once more a couple weeks later and he had a similar reaction. No neurosymptoms.  No problems with cognition or vision.  No problems with strength or balance.    History reviewed. No pertinent past medical history.  There are no active problems to display for this patient.   History reviewed. No pertinent surgical history.     Home Medications    Prior to Admission medications   Not on File    Family History Family History  Problem Relation Age of Onset   Asthma Mother    Diabetes Mother    Thyroid disease Mother     Social History Social History   Tobacco Use   Smoking status: Never   Smokeless tobacco: Never  Vaping Use   Vaping status: Never Used  Substance Use Topics   Alcohol use: Not Currently   Drug use: Never     Allergies   Patient has no known allergies.   Review of Systems Review of Systems   Physical Exam Triage Vital Signs ED Triage Vitals  Encounter  Vitals Group     BP 05/25/24 1417 (!) 144/86     Girls Systolic BP Percentile --      Girls Diastolic BP Percentile --      Boys Systolic BP Percentile --      Boys Diastolic BP Percentile --      Pulse Rate 05/25/24 1417 70     Resp 05/25/24 1417 16     Temp 05/25/24 1417 98.9 F (37.2 C)     Temp src --      SpO2 05/25/24 1417 99 %     Weight --      Height --      Head Circumference --      Peak Flow --      Pain Score 05/25/24 1419 10     Pain Loc --      Pain Education --      Exclude from Growth Chart --    No data found.  Updated Vital Signs BP (!) 144/86   Pulse 70   Temp 98.9 F (37.2 C)   Resp 16   SpO2 99%       Physical Exam Constitutional:      General: He is not in acute distress.  Appearance: He is well-developed and normal weight.  HENT:     Head: Normocephalic and atraumatic.     Mouth/Throat:     Mouth: Mucous membranes are moist.  Eyes:     General: No visual field deficit.    Extraocular Movements:     Right eye: Normal extraocular motion and no nystagmus.     Left eye: Normal extraocular motion and no nystagmus.     Conjunctiva/sclera: Conjunctivae normal.     Pupils: Pupils are equal, round, and reactive to light.     Right eye: Pupil is round and reactive.     Left eye: Pupil is round and reactive.     Comments: Discs are flat  Neck:     Comments: Neck muscles tight and tender Cardiovascular:     Rate and Rhythm: Normal rate.  Pulmonary:     Effort: Pulmonary effort is normal. No respiratory distress.  Abdominal:     General: There is no distension.     Palpations: Abdomen is soft.  Musculoskeletal:        General: Normal range of motion.     Cervical back: Normal range of motion.  Skin:    General: Skin is warm and dry.  Neurological:     Mental Status: He is alert and oriented to person, place, and time. Mental status is at baseline.     Cranial Nerves: No cranial nerve deficit, dysarthria or facial asymmetry.     Gait:  Gait normal.  Psychiatric:        Mood and Affect: Mood normal.        Behavior: Behavior normal.      UC Treatments / Results  Labs (all labs ordered are listed, but only abnormal results are displayed) Labs Reviewed - No data to display  EKG   Radiology No results found.  Procedures Procedures (including critical care time)  Medications Ordered in UC Medications - No data to display  Initial Impression / Assessment and Plan / UC Course  I have reviewed the triage vital signs and the nursing notes.  Pertinent labs & imaging results that were available during my care of the patient were reviewed by me and considered in my medical decision making (see chart for details).     Explained to the patient describing 2 different kinds of headache.  The muscle tension headache is the one present daily and is associated with a tightness in his neck muscles.  This is treated with anti-inflammatory medications, muscle relaxers at bedtime, good health habits such as eating regularly sleeping regularly getting enough exercise. Orgasmic headaches are different matter.  These require different evaluation.  Patient needs a CT of his head to make sure he does not have an aneurysm or subarachnoid hemorrhage.  Although unlikely, I did look this up and up-to-date on the recommendation is clear.  Discussed with patient she should go to the emergency room Final Clinical Impressions(s) / UC Diagnoses   Final diagnoses:  Muscle tension headache  Headache associated with orgasm     Discharge Instructions      For the muscle tension headache take ibuprofen up to 3 X a day for pain Take the flexeril at bedtime Ice or heat to neck muscles  For the other headache you should go to the ER   ED Prescriptions   None    PDMP not reviewed this encounter.   Maranda Jamee Jacob, MD 05/25/24 [phone]

## 2024-05-25 NOTE — Discharge Instructions (Signed)
 For the muscle tension headache take ibuprofen up to 3 X a day for pain Take the flexeril at bedtime Ice or heat to neck muscles  For the other headache you should go to the ER

## 2024-05-25 NOTE — ED Notes (Signed)
 Patient is being discharged from the Urgent Care and sent to the Emergency Department via private vehicle . Per Dr Maranda, patient is in need of higher level of care due to further evaluation. Patient is aware and verbalizes understanding of plan of care.  Vitals:   05/25/24 1417  BP: (!) 144/86  Pulse: 70  Resp: 16  Temp: 98.9 F (37.2 C)  SpO2: 99%

## 2024-05-25 NOTE — ED Triage Notes (Signed)
 HA x almost 2 months. Reports he is under a lot of stress. Reports his neck feels stiff, he feels nauseated, head hurts all over, a little confused. No fever. Has not had otc meds.

## 2024-08-08 ENCOUNTER — Ambulatory Visit
Admission: EM | Admit: 2024-08-08 | Discharge: 2024-08-08 | Disposition: A | Discharge status: Home / Self Care | Payer: Self-pay | Attending: Family Medicine | Admitting: Family Medicine

## 2024-08-08 ENCOUNTER — Other Ambulatory Visit: Payer: Self-pay

## 2024-08-08 DIAGNOSIS — R066 Hiccough: Secondary | ICD-10-CM

## 2024-08-08 DIAGNOSIS — Z9189 Other specified personal risk factors, not elsewhere classified: Secondary | ICD-10-CM

## 2024-08-08 MED ORDER — BACLOFEN 10 MG PO TABS: 10.0000 mg | ORAL_TABLET | Freq: Three times a day (TID) | ORAL | 0 refills | Status: DC

## 2024-08-08 NOTE — ED Triage Notes (Signed)
 Pt presenting with c/o constant hiccups x 2 weeks resulting in  bilateral rib pain and nausea. No medication taken for symptoms. Pt also requesting a STD test.

## 2024-08-08 NOTE — Discharge Instructions (Signed)
 Take the baclofen  as directed to help with hiccups May take 1 or 2 at bedtime  Your swab has been sent to the laboratory for analysis.  You will be called if any tests are positive

## 2024-08-08 NOTE — ED Provider Notes (Signed)
 [name]    CSN: [id number] Arrival date & time: 08/08/24  1505      History   Chief Complaint Chief Complaint  Patient presents with   Hiccups    HPI Scott Small is a 33 y.o. male.   Patient states that he has had hiccups every day for the last couple of months.  He will have pauses but then they started again.  They happen all day long.  He states his ribs are sore.  He feels nauseated.  He is very tired of the hiccups and wonders if there is treatment.  He does not have heartburn or GERD.  Appetite has been normal. Also endorses unprotected sex and would like an STD check.  He is asymptomatic    History reviewed. No pertinent past medical history.  There are no active problems to display for this patient.   History reviewed. No pertinent surgical history.     Home Medications    Prior to Admission medications   Medication Sig Start Date End Date Taking? Authorizing Provider  baclofen  (LIORESAL ) 10 MG tablet Take 1 tablet (10 mg total) by mouth 3 (three) times daily. 08/08/24  Yes Maranda Jamee Jacob, MD    Family History Family History  Problem Relation Age of Onset   Asthma Mother    Diabetes Mother    Thyroid disease Mother     Social History Social History   Tobacco Use   Smoking status: Never   Smokeless tobacco: Never  Vaping Use   Vaping status: Never Used  Substance Use Topics   Alcohol use: Not Currently   Drug use: Never     Allergies   Patient has no known allergies.   Review of Systems Review of Systems See HPI  Physical Exam Triage Vital Signs ED Triage Vitals  Encounter Vitals Group     BP 08/08/24 1524 (!) 142/85     Girls Systolic BP Percentile --      Girls Diastolic BP Percentile --      Boys Systolic BP Percentile --      Boys Diastolic BP Percentile --      Pulse Rate 08/08/24 1524 77     Resp 08/08/24 1524 20     Temp 08/08/24 1524 99 F (37.2 C)     Temp Source 08/08/24 1524 Oral      SpO2 08/08/24 1524 99 %     Weight 08/08/24 1527 161 lb (73 kg)     Height 08/08/24 1527 5' 7 (1.702 m)     Head Circumference --      Peak Flow --      Pain Score 08/08/24 1526 7     Pain Loc --      Pain Education --      Exclude from Growth Chart --    No data found.  Updated Vital Signs BP (!) 142/85 (BP Location: Right Arm)   Pulse 77   Temp 99 F (37.2 C) (Oral)   Resp 20   Ht 5' 7 (1.702 m)   Wt 73 kg   SpO2 99%   BMI 25.22 kg/m    Physical Exam Constitutional:      General: He is not in acute distress.    Appearance: He is well-developed.  HENT:     Head: Normocephalic and atraumatic.  Eyes:     Conjunctiva/sclera: Conjunctivae normal.     Pupils: Pupils are equal, round, and reactive to light.  Cardiovascular:  Rate and Rhythm: Normal rate and regular rhythm.     Heart sounds: Normal heart sounds.  Pulmonary:     Effort: Pulmonary effort is normal. No respiratory distress.     Breath sounds: Normal breath sounds.  Chest:     Chest wall: Tenderness present.  Abdominal:     General: There is distension.  Musculoskeletal:        General: Normal range of motion.     Cervical back: Normal range of motion.  Skin:    General: Skin is warm and dry.  Neurological:     Mental Status: He is alert.      UC Treatments / Results  Labs (all labs ordered are listed, but only abnormal results are displayed) Labs Reviewed  CYTOLOGY, (ORAL, ANAL, URETHRAL) ANCILLARY ONLY    EKG   Radiology No results found.  Procedures Procedures (including critical care time)  Medications Ordered in UC Medications - No data to display  Initial Impression / Assessment and Plan / UC Course  I have reviewed the triage vital signs and the nursing notes.  Pertinent labs & imaging results that were available during my care of the patient were reviewed by me and considered in my medical decision making (see chart for details).     Final Clinical Impressions(s) /  UC Diagnoses   Final diagnoses:  Intractable hiccups  At risk for sexually transmitted disease due to unprotected sex     Discharge Instructions      Take the baclofen  as directed to help with hiccups May take 1 or 2 at bedtime  Your swab has been sent to the laboratory for analysis.  You will be called if any tests are positive   ED Prescriptions     Medication Sig Dispense Auth. Provider   baclofen  (LIORESAL ) 10 MG tablet Take 1 tablet (10 mg total) by mouth 3 (three) times daily. 30 each Maranda Jamee Jacob, MD      PDMP not reviewed this encounter.   Maranda Jamee Jacob, MD 08/08/24 [phone]

## 2024-08-09 LAB — CYTOLOGY, (ORAL, ANAL, URETHRAL) ANCILLARY ONLY
Chlamydia: NEGATIVE
Comment: NEGATIVE
Comment: NEGATIVE
Comment: NORMAL
Neisseria Gonorrhea: NEGATIVE
Trichomonas: NEGATIVE

## 2024-08-16 ENCOUNTER — Other Ambulatory Visit: Payer: Self-pay

## 2024-08-16 ENCOUNTER — Ambulatory Visit: Payer: Self-pay

## 2024-08-16 ENCOUNTER — Ambulatory Visit
Admission: RE | Admit: 2024-08-16 | Discharge: 2024-08-16 | Disposition: A | Discharge status: Home / Self Care | Payer: Self-pay | Attending: Family Medicine | Admitting: Family Medicine

## 2024-08-16 VITALS — BP 144/81 | HR 72 | Temp 99.0°F | Resp 20 | Ht 67.0 in | Wt 160.0 lb

## 2024-08-16 DIAGNOSIS — M545 Low back pain, unspecified: Secondary | ICD-10-CM

## 2024-08-16 DIAGNOSIS — R0789 Other chest pain: Secondary | ICD-10-CM

## 2024-08-16 MED ORDER — PREDNISONE 10 MG (21) PO TBPK: ORAL_TABLET | Freq: Every day | ORAL | 0 refills | Status: DC

## 2024-08-16 NOTE — ED Provider Notes (Signed)
 [name]    CSN: [id number] Arrival date & time: 08/16/24  1136      History   Chief Complaint Chief Complaint  Patient presents with   Back Pain    Nausea, rib pain & hiccups as well as back pain - Entered by patient    HPI Scott Small is a 33 y.o. male.   HPI pleasant 33 year old male presents with nausea, back pain and rib pain for 3 weeks.  Patient also is concerned with intractable hiccups.  Patient reports having mild allergic reaction to baclofen  prescribed on 08/08/2024.  History reviewed. No pertinent past medical history.  There are no active problems to display for this patient.   History reviewed. No pertinent surgical history.     Home Medications    Prior to Admission medications  Medication Sig Start Date End Date Taking? Authorizing Provider  predniSONE  (STERAPRED UNI-PAK 21 TAB) 10 MG (21) TBPK tablet Take by mouth daily. Take 6 tabs by mouth daily  for 2 days, then 5 tabs for 2 days, then 4 tabs for 2 days, then 3 tabs for 2 days, 2 tabs for 2 days, then 1 tab by mouth daily for 2 days 08/16/24  Yes Teddy Sharper, FNP    Family History Family History  Problem Relation Age of Onset   Asthma Mother    Diabetes Mother    Thyroid disease Mother     Social History Social History[1]   Allergies   Baclofen    Review of Systems Review of Systems  Musculoskeletal:  Positive for back pain.       Bilateral chest wall pain for 3 weeks per patient  All other systems reviewed and are negative.    Physical Exam Triage Vital Signs ED Triage Vitals  Encounter Vitals Group     BP      Girls Systolic BP Percentile      Girls Diastolic BP Percentile      Boys Systolic BP Percentile      Boys Diastolic BP Percentile      Pulse      Resp      Temp      Temp src      SpO2      Weight      Height      Head Circumference      Peak Flow      Pain Score      Pain Loc      Pain Education      Exclude from Growth Chart     No data found.  Updated Vital Signs BP (!) 144/81 (BP Location: Right Arm)   Pulse 72   Temp 99 F (37.2 C) (Oral)   Resp 20   Ht 5' 7 (1.702 m)   Wt 160 lb (72.6 kg)   SpO2 100%   BMI 25.06 kg/m    Physical Exam Vitals and nursing note reviewed.  Constitutional:      Appearance: Normal appearance. He is normal weight. He is not ill-appearing.  HENT:     Head: Normocephalic and atraumatic.     Mouth/Throat:     Mouth: Mucous membranes are moist.     Pharynx: Oropharynx is clear.  Eyes:     Extraocular Movements: Extraocular movements intact.     Conjunctiva/sclera: Conjunctivae normal.     Pupils: Pupils are equal, round, and reactive to light.  Cardiovascular:     Rate and Rhythm: Normal rate and regular rhythm.  Heart sounds: Normal heart sounds.  Pulmonary:     Effort: Pulmonary effort is normal.     Breath sounds: Normal breath sounds. No wheezing, rhonchi or rales.     Comments: Patient reporting bilateral chest wall tenderness on exam Musculoskeletal:        General: Normal range of motion.     Comments: Patient reporting tenderness over L5-S1/paraspinous muscles of lower lumbar spine, no deformity noted  Skin:    General: Skin is warm and dry.  Neurological:     General: No focal deficit present.     Mental Status: He is alert and oriented to person, place, and time. Mental status is at baseline.  Psychiatric:        Mood and Affect: Mood normal.        Behavior: Behavior normal.      UC Treatments / Results  Labs (all labs ordered are listed, but only abnormal results are displayed) Labs Reviewed - No data to display  EKG   Radiology DG Lumbar Spine Complete Result Date: 08/16/2024 EXAM: 4 VIEW(S) XRAY OF THE LUMBAR SPINE 08/16/2024 12:34:00 PM COMPARISON: 08/28/2018 CLINICAL HISTORY: Bilateral low back pain for 3 weeks. FINDINGS: LUMBAR SPINE: BONES: Vertebral body heights are maintained. Alignment is maintained. No radiographic evidence  of fracture or pars defect. DISCS AND DEGENERATIVE CHANGES: Intervertebral disc spaces are maintained. No severe degenerative changes. SOFT TISSUES: No acute abnormality. IMPRESSION: 1. No acute osseous abnormality. Electronically signed by: Donnice Mania MD 08/16/2024 01:27 PM EST RP Workstation: [id number]   DG Chest 2 View Result Date: 08/16/2024 EXAM: 2 VIEW(S) XRAY OF THE CHEST 08/16/2024 12:34:00 PM COMPARISON: None available. CLINICAL HISTORY: Bilateral rib pain x 3 weeks FINDINGS: LUNGS AND PLEURA: No focal pulmonary opacity. No pleural effusion. No pneumothorax. HEART AND MEDIASTINUM: No acute abnormality of the cardiac and mediastinal silhouettes. BONES AND SOFT TISSUES: No acute osseous abnormality. IMPRESSION: 1. No acute cardiopulmonary process identified. Electronically signed by: Donnice Mania MD 08/16/2024 01:10 PM EST RP Workstation: [id number]    Procedures Procedures (including critical care time)  Medications Ordered in UC Medications - No data to display  Initial Impression / Assessment and Plan / UC Course  I have reviewed the triage vital signs and the nursing notes.  Pertinent labs & imaging results that were available during my care of the patient were reviewed by me and considered in my medical decision making (see chart for details).     MDM: 1.  Acute bilateral low back pain without sciatica-lumbar spine x-ray results revealed above, patient advised, Rx'd Sterapred Unipak (42 tab 10 mg taper): Take as directed; 2.  Rib pain-bilateral.  Chest x-ray results revealed above, patient advised, Rx'd Sterapred Unipak (42 tab 10 mg taper). Advised patient of chest x-ray results with hardcopy and images provided.  Advised we will follow-up with lumbar spine x-ray results once received.  Advised patient take medication as directed with food to completion.  Encouraged increase daily water intake to 64 ounces per day while taking these medications.  Advised if symptoms worsen and/or  unresolved please follow-up with PCP or here for further evaluation.  Patient discharged home, hemodynamically stable.  Final Clinical Impressions(s) / UC Diagnoses   Final diagnoses:  Acute bilateral low back pain without sciatica  Rib pain     Discharge Instructions      Advised patient of chest x-ray results with hardcopy and images provided.  Advised we will follow-up with lumbar spine x-ray results once received.  Advised patient  take medication as directed with food to completion.  Encouraged increase daily water intake to 64 ounces per day while taking these medications.  Advised if symptoms worsen and/or unresolved please follow-up with PCP or here for further evaluation.     ED Prescriptions     Medication Sig Dispense Auth. Provider   predniSONE  (STERAPRED UNI-PAK 21 TAB) 10 MG (21) TBPK tablet Take by mouth daily. Take 6 tabs by mouth daily  for 2 days, then 5 tabs for 2 days, then 4 tabs for 2 days, then 3 tabs for 2 days, 2 tabs for 2 days, then 1 tab by mouth daily for 2 days 42 tablet Teddy Sharper, FNP      PDMP not reviewed this encounter.    [1]  Social History Tobacco Use   Smoking status: Never   Smokeless tobacco: Never  Vaping Use   Vaping status: Never Used  Substance Use Topics   Alcohol use: Not Currently   Drug use: Never     Teddy Sharper, FNP 08/16/24 1422

## 2024-08-16 NOTE — Discharge Instructions (Addendum)
 Advised patient of chest x-ray results with hardcopy and images provided.  Advised we will follow-up with lumbar spine x-ray results once received.  Advised patient take medication as directed with food to completion.  Encouraged increase daily water intake to 64 ounces per day while taking these medications.  Advised if symptoms worsen and/or unresolved please follow-up with PCP or here for further evaluation.

## 2024-08-16 NOTE — ED Triage Notes (Signed)
 Pt presenting with c/o of back pain. Pt stated he was seen at Urgent Care 2 weeks ago for back pain and was prescribed Baclofen  which he believes he is allergic to due to him having difficulty swallowing after taking it. No  other medication  taken for symptoms.

## 2024-09-05 ENCOUNTER — Ambulatory Visit
Admission: EM | Admit: 2024-09-05 | Discharge: 2024-09-05 | Disposition: A | Discharge status: Home / Self Care | Attending: Emergency Medicine | Admitting: Emergency Medicine

## 2024-09-05 DIAGNOSIS — Z113 Encounter for screening for infections with a predominantly sexual mode of transmission: Secondary | ICD-10-CM | POA: Insufficient documentation

## 2024-09-05 DIAGNOSIS — R300 Dysuria: Secondary | ICD-10-CM | POA: Diagnosis present

## 2024-09-05 LAB — POCT URINE DIPSTICK
Bilirubin, UA: NEGATIVE
Glucose, UA: NEGATIVE mg/dL
Ketones, POC UA: NEGATIVE mg/dL
Leukocytes, UA: NEGATIVE
Nitrite, UA: NEGATIVE
Protein Ur, POC: NEGATIVE mg/dL
Spec Grav, UA: 1.01
Urobilinogen, UA: 0.2 U/dL
pH, UA: 7

## 2024-09-05 NOTE — ED Provider Notes (Signed)
 "    Scott Small    CSN: [id number] Arrival date & time: 09/05/24  1307    HISTORY   Chief Complaint  Patient presents with   Back Pain   SEXUALLY TRANSMITTED DISEASE   HPI Scott Small is a pleasant, 34 y.o. male who presents to urgent care today. Patient requests STD screening. Patient endorses burning with urination and concern for STD exposure.   Patient denies penile discharge, testicular pain or swelling, scrotal pain or swelling, perineal pain, rectal pain, pain with defecation, abdominal pain, suprapubic pain, and genital lesion.  Patient also complains continued mid back and right sided rib pain.  Was seen at [facility] in [place] on December 15 for this, was provided with a steroid, states today that he never picked up the prescription.  The history is provided by the patient.  Back Pain   History reviewed. No pertinent past medical history. There are no active problems to display for this patient.  History reviewed. No pertinent surgical history.  Home Medications    Prior to Admission medications  Medication Sig Start Date End Date Taking? Authorizing Provider  predniSONE  (STERAPRED UNI-PAK 21 TAB) 10 MG (21) TBPK tablet Take by mouth daily. Take 6 tabs by mouth daily  for 2 days, then 5 tabs for 2 days, then 4 tabs for 2 days, then 3 tabs for 2 days, 2 tabs for 2 days, then 1 tab by mouth daily for 2 days 08/16/24   Teddy Sharper, FNP    Family History Family History  Problem Relation Age of Onset   Asthma Mother    Diabetes Mother    Thyroid disease Mother    Social History Social History[1] Allergies   Baclofen  and Iodinated contrast media  Review of Systems Review of Systems  Musculoskeletal:  Positive for back pain.   Pertinent findings revealed after performing a 14 point review of systems has been noted in the history of present illness.  Physical Exam Vital Signs BP 138/76 (BP Location: Right Arm)   Pulse  70   Temp 98.4 F (36.9 C) (Oral)   Resp 18   SpO2 97%   No data found.  Physical Exam Vitals and nursing note reviewed.  Constitutional:      General: He is awake. He is not in acute distress.    Appearance: Normal appearance. He is not ill-appearing.  HENT:     Head: Normocephalic and atraumatic.  Eyes:     General: Lids are normal.        Right eye: No discharge.        Left eye: No discharge.     Conjunctiva/sclera: Conjunctivae normal.     Right eye: Right conjunctiva is not injected.     Left eye: Left conjunctiva is not injected.  Neck:     Trachea: Trachea and phonation normal.  Cardiovascular:     Rate and Rhythm: Normal rate and regular rhythm.  Pulmonary:     Effort: Pulmonary effort is normal.  Genitourinary:    Comments: Pt politely declines GU exam, pt did provide a penile swab for testing.   Musculoskeletal:     Cervical back: Full passive range of motion without pain, normal range of motion and neck supple.  Lymphadenopathy:     Cervical: No cervical adenopathy.  Skin:    General: Skin is warm and dry.     Findings: No erythema or rash.  Neurological:     General: No focal  deficit present.     Mental Status: He is alert and oriented to person, place, and time. Mental status is at baseline.  Psychiatric:        Attention and Perception: Attention and perception normal.        Mood and Affect: Mood normal.        Speech: Speech normal.        Behavior: Behavior normal. Behavior is cooperative.        Thought Content: Thought content normal.     Visual Acuity Right Eye Distance:   Left Eye Distance:   Bilateral Distance:    Right Eye Near:   Left Eye Near:    Bilateral Near:     Small Couse / Diagnostics / Procedures:     Radiology No results found.  Procedures Procedures (including critical care time) EKG  Pending results:  Labs Reviewed  POCT URINE DIPSTICK  CYTOLOGY, (ORAL, ANAL, URETHRAL) ANCILLARY ONLY    Medications Ordered in  Small: Medications - No data to display  Small Diagnoses / Final Clinical Impressions(s)   I have reviewed the triage vital signs and the nursing notes.  Pertinent labs & imaging results that were available during my care of the patient were reviewed by me and considered in my medical decision making (see chart for details).    Final diagnoses:  Dysuria  Patient advised to pick up his prescription for Sterapred to address his back and rib pain.  Also recommend patient consider follow-up with PCP to discuss referral to physical therapy. STD screening was performed, patient advised that the results be posted to their MyChart and if any of the results are positive, they will be notified by phone, further treatment will be provided as indicated based on results of STD screening. Patient was advised to abstain from sexual intercourse until that they receive the results of their STD testing.  Patient was also advised to use condoms to protect themselves from STD exposure. Urinalysis today was normal. Return precautions advised.  Drug allergies reviewed, all questions addressed.   Please see discharge instructions below for details of plan of care as provided to patient. ED Prescriptions   None    PDMP not reviewed this encounter.  Disposition Upon Discharge:  Condition: stable for discharge home  Patient presented with concern for an acute illness with associated systemic symptoms and significant discomfort requiring urgent management. In my opinion, this is a condition that a prudent lay person (someone who possesses an average knowledge of health and medicine) may potentially expect to result in complications if not addressed urgently such as respiratory distress, impairment of bodily function or dysfunction of bodily organs.   As such, the patient has been evaluated and assessed, work-up was performed and treatment was provided in alignment with urgent care protocols and evidence based medicine.   Patient/parent/caregiver has been advised that the patient may require follow up for further testing and/or treatment if the symptoms continue in spite of treatment, as clinically indicated and appropriate.  Routine symptom specific, illness specific and/or disease specific instructions were discussed with the patient and/or caregiver at length.  Prevention strategies for avoiding STD exposure were also discussed.  The patient will follow up with their current PCP if and as advised. If the patient does not currently have a PCP we will assist them in obtaining one.   The patient may need specialty follow up if the symptoms continue, in spite of conservative treatment and management, for further workup, evaluation, consultation  and treatment as clinically indicated and appropriate.  Patient/parent/caregiver verbalized understanding and agreement of plan as discussed.  All questions were addressed during visit.  Please see discharge instructions below for further details of plan.  Discharge Instructions   None     This office note has been dictated using Dragon speech recognition software.  Unfortunately, this method of dictation can sometimes lead to typographical or grammatical errors.  I apologize for your inconvenience in advance if this occurs.  Please do not hesitate to reach out to me if clarification is needed.        [1]  Social History Tobacco Use   Smoking status: Never   Smokeless tobacco: Never  Vaping Use   Vaping status: Never Used  Substance Use Topics   Alcohol use: Not Currently   Drug use: Never     Joesph Shaver Scales, PA-C 09/05/24 1353  "

## 2024-09-05 NOTE — ED Triage Notes (Signed)
 Patient c/o mid back pain and ribs pain to right side, the patient states it is worse when standing for too long (started 2 months ago). The patient reports he also has a lot of hiccups (started 3 months ago). The patient states while lifting weights he felt a pop in his back.   Home interventions: none  Patient requesting STD testing and states he has no symptoms but just wants a routine check. Patient declines RPR and HIV testing.

## 2024-09-05 NOTE — Discharge Instructions (Signed)
 Please be sure to pick up and begin taking your prescription for prednisone  to see if this relieves the pain in your back and your ribs.  The results of your penile swab STD testing today which screens for gonorrhea, chlamydia, and trichomonas will be posted to your MyChart account once it is complete.  This typically takes 1 to 3 days.                    If any of your results are abnormal, you will receive a phone call regarding treatment.  Prescriptions, if any are needed, will be provided for you at your pharmacy.                       Please remember that there are only 2 ways to prevent transmission of sexually transmitted disease: to not have sex or to always use condoms when having sex.                        The urinalysis that we performed in the clinic today was normal and not concerning for urinary tract infection.    If you have not had complete resolution of your symptoms after completing any recommended treatment or if your symptoms worsen, please return for repeat evaluation.                    Thank you for visiting [facility] today.  We appreciate the opportunity to participate in your care.

## 2024-09-06 NOTE — Progress Notes (Signed)
 Subjective Scott Small is a 34 y.o. male who presents for Spasms (Pt c/o x 3-4 months right upper ribs and midsection, muscle spasms and pain and R mid back w/ pain. Standing, bending down and lifting hurts. Worst when laying down. 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 ) History of Present Illness This is a 34 year old male with a history of back pain presenting with chest pain.  The patient reports experiencing significant back pain for the past 3 to 4 months, which is accompanied by rib pain and muscle spasms. He also experiences constant nausea, particularly severe upon waking in the morning. The pain is described as constant and has not changed in intensity over the past few months. The pain is located in the middle and upper back, and he also reports tenderness in the right upper quadrant of the abdomen.  The patient sought medical attention at an urgent care facility on 09/05/2024 due to the severity of his symptoms. A urine test conducted at that time indicated the presence of blood, but the result was inconclusive as he had consumed beets prior to the test. He is seeking a retest of his urine today to confirm the findings. The patient has recently established care with a primary care physician and has an appointment scheduled for 09/13/2024.  Review of Systems  Objective Blood pressure 147/90, pulse 73, temperature 98.1 F (36.7 C), temperature source Oral, resp. rate 18, height 5' 7 (1.702 m), weight 160 lb 12.8 oz (72.9 kg), SpO2 99%. Physical Exam Cardiovascular: Heart is regular in rhythm. Respiratory: Lungs are clear to auscultation bilaterally. Gastrointestinal: Abdomen is soft with moderate right upper quadrant abdominal tenderness. No rebound or guarding. No other abdominal tenderness noted. No CVA tenderness.  Results Laboratory Studies Urine test positive for blood.    POC Urine Dipstick  [[id number]] (Abnormal)  Resulted: 09/06/24 1809, Result status: Final result    Components    Component Value Reference Range Flag  Color Yellow Yellow --  Clarity Clear Clear --  Glucose,Urine Negative Negative mg/dL --  Bilirubin Negative Negative --  Ketones,Urine Negative Negative mg/dL --  Specific Gravity 8.979 1.005, 1.010, 1.015, 1.020, 1.025, 1.030 --  Blood 1+ Negative A*  pH 6.0 -- --  Protein Negative Negative mg/dL --  Urobilinogen 0.2 0.2, 1.0 EU/dL --  Nitrite Negative Negative --  Leukocyte Esterase Negative Negative --            Assessment & Plan Initial Assessment: The patient reports significant back pain, rib pain, muscle spasms, and constant nausea for the past 3-4 months. Physical examination reveals moderate tenderness in the right upper quadrant of the abdomen.  Urgent Care Course: - Urine test conducted, tested positive for blood  Final Assessment: Urine test confirmed the presence of blood. Initiation of omeprazole for potential ulcer-related discomfort. Advised to follow up with primary care physician for further evaluation, including abdominal imaging and blood work.  Clinical Impression: - Right upper quadrant abdominal pain - Hematuria  Disposition: - Follow-Up: Appointment with PCP on 09/13/2024  Patient Education: Advised to go to the hospital if pain worsens.     Attestation   *Some images could not be shown.

## 2024-09-07 LAB — CYTOLOGY, (ORAL, ANAL, URETHRAL) ANCILLARY ONLY
Chlamydia: NEGATIVE
Comment: NEGATIVE
Comment: NEGATIVE
Comment: NORMAL
Neisseria Gonorrhea: NEGATIVE
Trichomonas: NEGATIVE

## 2024-09-08 ENCOUNTER — Ambulatory Visit: Payer: Self-pay | Admitting: Emergency Medicine

## 2024-09-13 ENCOUNTER — Ambulatory Visit: Admitting: Urology

## 2024-09-13 ENCOUNTER — Encounter: Payer: Self-pay | Admitting: Urology

## 2024-09-13 VITALS — BP 136/86 | HR 67 | Ht 67.0 in | Wt 160.0 lb

## 2024-09-13 DIAGNOSIS — R319 Hematuria, unspecified: Secondary | ICD-10-CM | POA: Diagnosis not present

## 2024-09-13 LAB — URINALYSIS, ROUTINE W REFLEX MICROSCOPIC
Bilirubin, UA: NEGATIVE
Glucose, UA: NEGATIVE
Ketones, UA: NEGATIVE
Leukocytes,UA: NEGATIVE
Nitrite, UA: NEGATIVE
Protein,UA: NEGATIVE
RBC, UA: NEGATIVE
Specific Gravity, UA: 1.01 (ref 1.005–1.030)
Urobilinogen, Ur: 1 mg/dL (ref 0.2–1.0)
pH, UA: 7 (ref 5.0–7.5)

## 2024-09-13 NOTE — Progress Notes (Signed)
 "  Assessment: 1. Hematuria - dipstick     Plan: I personally reviewed the patient's chart including provider notes, lab and imaging results. His urinalysis today is negative. I think the dipstick positive urinalysis was due to his prior urethral swab for STD testing. Recommend repeat urinalysis in 3-4 weeks.  Chief Complaint:  Chief Complaint  Patient presents with   Hematuria    History of Present Illness:  Scott Small is a 34 y.o. male who is seen for evaluation of hematuria on dipstick U/A. He was seen in December 2025 by urgent care for evaluation of back and rib pain.  Plain films of the lumbar spine showed no obvious abnormalities.  He was prescribed a steroid Dosepak but did not take the medication.  He returned to urgent care on 09/05/2024 with continued back pain and concern for STD.  STD evaluation was negative.  Dipstick urinalysis done after urethral swab showed trace blood. He was seen at [facility] urgent care on 09/06/2024.  Dipstick urinalysis showed 1+ blood. No history of gross hematuria or flank pain.  No dysuria or other lower urinary tract symptoms.  He has a prior history of chlamydia.  Past Medical History:  Past Medical History:  Diagnosis Date   No known health problems     Past Surgical History:  Past Surgical History:  Procedure Laterality Date   None      Allergies:  Allergies[1]  Family History:  Family History  Problem Relation Age of Onset   Asthma Mother    Diabetes Mother    Thyroid disease Mother     Social History:  Social History[2]  Review of symptoms:  Constitutional:  Negative for unexplained weight loss, night sweats, fever, chills ENT:  Negative for nose bleeds, sinus pain, painful swallowing CV:  Negative for chest pain, shortness of breath, exercise intolerance, palpitations, loss of consciousness Resp:  Negative for cough, wheezing, shortness of breath GI:  Negative for nausea, vomiting, diarrhea, bloody  stools GU:  Positives noted in HPI; otherwise negative for gross hematuria, dysuria, urinary incontinence Neuro:  Negative for seizures, poor balance, limb weakness, slurred speech Psych:  Negative for lack of energy, depression, anxiety Endocrine:  Negative for polydipsia, polyuria, symptoms of hypoglycemia (dizziness, hunger, sweating) Hematologic:  Negative for anemia, purpura, petechia, prolonged or excessive bleeding, use of anticoagulants  Allergic:  Negative for difficulty breathing or choking as a result of exposure to anything; no shellfish allergy; no allergic response (rash/itch) to materials, foods  Physical exam: BP 136/86   Pulse 67   Ht 5' 7 (1.702 m)   Wt 160 lb (72.6 kg)   BMI 25.06 kg/m  GENERAL APPEARANCE:  Well appearing, well developed, well nourished, NAD HEENT: Atraumatic, Normocephalic, oropharynx clear. NECK: Supple without lymphadenopathy or thyromegaly. LUNGS: Clear to auscultation bilaterally. HEART: Regular Rate and Rhythm without murmurs, gallops, or rubs. ABDOMEN: Soft, non-tender, No Masses. EXTREMITIES: Moves all extremities well.  Without clubbing, cyanosis, or edema. NEUROLOGIC:  Alert and oriented x 3, normal gait, CN II-XII grossly intact.  MENTAL STATUS:  Appropriate. BACK:  Non-tender to palpation.  No CVAT SKIN:  Warm, dry and intact.    Results: U/A:  negative     [1]  Allergies Allergen Reactions   Baclofen  Other (See Comments)    Difficulty swallowing   Iodinated Contrast Media Other (See Comments)    Reports sensation of throat closing. No signs or symptoms of airway distress. Improved sensation with benadryl .  [2]  Social History Tobacco  Use   Smoking status: Never   Smokeless tobacco: Never  Vaping Use   Vaping status: Never Used  Substance Use Topics   Alcohol use: Not Currently   Drug use: Never   "

## 2024-10-05 ENCOUNTER — Other Ambulatory Visit: Payer: Self-pay

## 2024-10-05 ENCOUNTER — Ambulatory Visit (INDEPENDENT_AMBULATORY_CARE_PROVIDER_SITE_OTHER)

## 2024-10-05 ENCOUNTER — Inpatient Hospital Stay: Admission: RE | Admit: 2024-10-05 | Discharge: 2024-10-05

## 2024-10-05 VITALS — BP 160/90 | HR 66 | Temp 97.6°F | Resp 16

## 2024-10-05 DIAGNOSIS — R3129 Other microscopic hematuria: Secondary | ICD-10-CM

## 2024-10-05 DIAGNOSIS — R1013 Epigastric pain: Secondary | ICD-10-CM

## 2024-10-05 DIAGNOSIS — Z113 Encounter for screening for infections with a predominantly sexual mode of transmission: Secondary | ICD-10-CM

## 2024-10-05 DIAGNOSIS — R300 Dysuria: Secondary | ICD-10-CM

## 2024-10-05 DIAGNOSIS — R369 Urethral discharge, unspecified: Secondary | ICD-10-CM

## 2024-10-05 LAB — POCT URINE DIPSTICK
Bilirubin, UA: NEGATIVE
Glucose, UA: NEGATIVE mg/dL
Ketones, POC UA: NEGATIVE mg/dL
Leukocytes, UA: NEGATIVE
Nitrite, UA: NEGATIVE
POC PROTEIN,UA: NEGATIVE
Spec Grav, UA: 1.015
Urobilinogen, UA: 0.2 U/dL
pH, UA: 7

## 2024-10-05 MED ORDER — OMEPRAZOLE 20 MG PO CPDR: 20.0000 mg | DELAYED_RELEASE_CAPSULE | Freq: Every day | ORAL | 1 refills | Status: AC

## 2024-10-05 MED ORDER — POLYETHYLENE GLYCOL 3350 17 GM/SCOOP PO POWD: 17.0000 g | Freq: Every day | ORAL | 0 refills | Status: AC | PRN

## 2024-10-05 NOTE — Discharge Instructions (Signed)
 Your blood work, urine, and swab was sent to the lab for further testing. Someone from our office will call you with your results.  In the meantime, I have sent something for constipation as well as for GERD. Please take as directed.   If work up is normal and no improvement, recommend follow up with GI.  Please noted our office is limited in the tests and imaging we can perform at our facility. Your evaluation was not suggestive of any emergent condition requiring medical intervention at this time. However, some abdominal problems make take more time to appear. Therefore, it is very important for you to pay attention to any new symptoms or worsening of your current condition.   Please go directly to the Emergency Department immediately should you begin to feel worse in any way or have any of the following symptoms: increasing or different abdominal pain, persistent vomiting, inability to drink fluids, fevers, bloody bowel movements, or begin vomiting blood.

## 2024-10-05 NOTE — ED Triage Notes (Signed)
 C/O dysuria and generalized abdominal pain for 3 weeks. Patient denies hematuria. C/O urinary frequency. Patient requesting STI testing and states clear penile discharge. C/O slight nausea, denies vomiting or diarrhea.

## 2024-10-06 ENCOUNTER — Ambulatory Visit (HOSPITAL_COMMUNITY): Payer: Self-pay

## 2024-10-06 LAB — CBC WITH DIFFERENTIAL/PLATELET
Basophils Absolute: 0 10*3/uL (ref 0.0–0.2)
Basos: 1 %
EOS (ABSOLUTE): 0.1 10*3/uL (ref 0.0–0.4)
Eos: 2 %
Hematocrit: 52.7 % — ABNORMAL HIGH (ref 37.5–51.0)
Hemoglobin: 16.9 g/dL (ref 13.0–17.7)
Immature Grans (Abs): 0 10*3/uL (ref 0.0–0.1)
Immature Granulocytes: 0 %
Lymphocytes Absolute: 2 10*3/uL (ref 0.7–3.1)
Lymphs: 35 %
MCH: 26.6 pg (ref 26.6–33.0)
MCHC: 32.1 g/dL (ref 31.5–35.7)
MCV: 83 fL (ref 79–97)
Monocytes Absolute: 0.7 10*3/uL (ref 0.1–0.9)
Monocytes: 12 %
Neutrophils Absolute: 2.9 10*3/uL (ref 1.4–7.0)
Neutrophils: 50 %
Platelets: 341 10*3/uL (ref 150–450)
RBC: 6.35 x10E6/uL — ABNORMAL HIGH (ref 4.14–5.80)
RDW: 13 % (ref 11.6–15.4)
WBC: 5.7 10*3/uL (ref 3.4–10.8)

## 2024-10-06 LAB — COMPREHENSIVE METABOLIC PANEL WITH GFR
ALT: 41 [IU]/L (ref 0–44)
AST: 32 [IU]/L (ref 0–40)
Albumin: 5 g/dL (ref 4.1–5.1)
Alkaline Phosphatase: 78 [IU]/L (ref 47–123)
BUN/Creatinine Ratio: 12 (ref 9–20)
BUN: 13 mg/dL (ref 6–20)
Bilirubin Total: 0.7 mg/dL (ref 0.0–1.2)
CO2: 21 mmol/L (ref 20–29)
Calcium: 9.7 mg/dL (ref 8.7–10.2)
Chloride: 102 mmol/L (ref 96–106)
Creatinine, Ser: 1.13 mg/dL (ref 0.76–1.27)
Globulin, Total: 2.8 g/dL (ref 1.5–4.5)
Glucose: 85 mg/dL (ref 70–99)
Potassium: 4.3 mmol/L (ref 3.5–5.2)
Sodium: 139 mmol/L (ref 134–144)
Total Protein: 7.8 g/dL (ref 6.0–8.5)
eGFR: 89 mL/min/{1.73_m2}

## 2024-10-06 LAB — CYTOLOGY, (ORAL, ANAL, URETHRAL) ANCILLARY ONLY
Chlamydia: NEGATIVE
Comment: NEGATIVE
Comment: NEGATIVE
Comment: NORMAL
Neisseria Gonorrhea: NEGATIVE
Trichomonas: NEGATIVE

## 2024-10-06 LAB — LIPASE: Lipase: 19 U/L (ref 13–78)

## 2024-10-06 LAB — URINE CULTURE: Culture: NO GROWTH

## 2024-10-11 ENCOUNTER — Other Ambulatory Visit
